# Patient Record
Sex: Female | Born: 1965 | Race: White | Hispanic: No | Marital: Married | Smoking: Former smoker
Health system: Southern US, Community
[De-identification: ages and names within clinical notes are randomized; demographics above are authoritative.]

## PROBLEM LIST (undated history)

## (undated) DIAGNOSIS — F32A Depression, unspecified: Secondary | ICD-10-CM

## (undated) DIAGNOSIS — R079 Chest pain, unspecified: Secondary | ICD-10-CM

## (undated) DIAGNOSIS — E7800 Pure hypercholesterolemia, unspecified: Secondary | ICD-10-CM

## (undated) DIAGNOSIS — K5792 Diverticulitis of intestine, part unspecified, without perforation or abscess without bleeding: Secondary | ICD-10-CM

## (undated) DIAGNOSIS — I10 Essential (primary) hypertension: Secondary | ICD-10-CM

## (undated) DIAGNOSIS — M545 Low back pain, unspecified: Secondary | ICD-10-CM

## (undated) DIAGNOSIS — F419 Anxiety disorder, unspecified: Secondary | ICD-10-CM

## (undated) DIAGNOSIS — G4730 Sleep apnea, unspecified: Secondary | ICD-10-CM

## (undated) DIAGNOSIS — E119 Type 2 diabetes mellitus without complications: Secondary | ICD-10-CM

## (undated) DIAGNOSIS — M069 Rheumatoid arthritis, unspecified: Secondary | ICD-10-CM

## (undated) DIAGNOSIS — F329 Major depressive disorder, single episode, unspecified: Secondary | ICD-10-CM

## (undated) HISTORY — DX: Diverticulitis of intestine, part unspecified, without perforation or abscess without bleeding: K57.92

## (undated) HISTORY — DX: Chest pain, unspecified: R07.9

## (undated) HISTORY — DX: Low back pain, unspecified: M54.50

## (undated) HISTORY — DX: Rheumatoid arthritis, unspecified: M06.9

## (undated) HISTORY — PX: ABDOMINAL HYSTERECTOMY: SHX81

## (undated) HISTORY — DX: Sleep apnea, unspecified: G47.30

## (undated) HISTORY — DX: Anxiety disorder, unspecified: F41.9

## (undated) HISTORY — PX: OTHER SURGICAL HISTORY: SHX169

## (undated) HISTORY — DX: Pure hypercholesterolemia, unspecified: E78.00

## (undated) HISTORY — DX: Type 2 diabetes mellitus without complications: E11.9

## (undated) HISTORY — PX: CHOLECYSTECTOMY: SHX55

## (undated) HISTORY — PX: COLONOSCOPY: SHX174

## (undated) HISTORY — PX: REPLACEMENT TOTAL KNEE: SUR1224

## (undated) HISTORY — DX: Low back pain: M54.5

## (undated) HISTORY — DX: Depression, unspecified: F32.A

## (undated) HISTORY — DX: Essential (primary) hypertension: I10

---
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
## 2016-12-21 HISTORY — PX: ESOPHAGOGASTRODUODENOSCOPY: SHX1529

## 2018-07-03 ENCOUNTER — Encounter: Payer: Self-pay | Admitting: *Deleted

## 2018-07-04 ENCOUNTER — Ambulatory Visit (INDEPENDENT_AMBULATORY_CARE_PROVIDER_SITE_OTHER): Payer: PRIVATE HEALTH INSURANCE | Admitting: Cardiology

## 2018-07-04 ENCOUNTER — Encounter: Payer: Self-pay | Admitting: *Deleted

## 2018-07-04 ENCOUNTER — Encounter: Payer: Self-pay | Admitting: Cardiology

## 2018-07-04 VITALS — BP 148/84 | HR 76 | Ht 66.0 in | Wt 276.6 lb

## 2018-07-04 DIAGNOSIS — R079 Chest pain, unspecified: Secondary | ICD-10-CM

## 2018-07-04 MED ORDER — METOPROLOL TARTRATE 50 MG PO TABS: 50.0000 mg | ORAL_TABLET | Freq: Once | ORAL | 0 refills | Status: DC

## 2018-07-04 MED ORDER — ASPIRIN EC 81 MG PO TBEC: 81.0000 mg | DELAYED_RELEASE_TABLET | Freq: Every day | ORAL | 3 refills | Status: DC

## 2018-07-04 NOTE — Patient Instructions (Addendum)
Medication Instructions:   Your physician has recommended you make the following change in your medication:   Start aspirin 81 mg by mouth daily  Continue all other medications the same  Labwork:  Your physician recommends that you return for lab work in: BMET for coronary CTA  Testing/Procedures:  Coronary CTA to be done at [place]:  Your physician recommends that you schedule a follow-up appointment in: pending.  Any Other Special Instructions Will Be Listed Below (If Applicable). Please arrive at the [facility] main entrance of [facility] at xx:xx AM (30-45 minutes prior to test start time)  [facility] [address] [place], [place] [address] [phone]  Proceed to the [facility] Radiology Department (First Floor).  Please follow these instructions carefully (unless otherwise directed):  On the Night Before the Test: Drink plenty of water. Do not consume any caffeinated/decaffeinated beverages or chocolate 12 hours prior to your test. Do not take any antihistamines 12 hours prior to your test. If you take Metformin do not take 24 hours prior to test and 48 hours after test.  On the Day of the Test: Drink plenty of water. Do not drink any water within one hour of the test. Do not eat any food 4 hours prior to the test. You may take your regular medications prior to the test. IF NOT ON A BETA BLOCKER - Take 50 mg of lopressor (metoprolol) one hour before the test.   After the Test: Drink plenty of water. After receiving IV contrast, you may experience a mild flushed feeling. This is normal. On occasion, you may experience a mild rash up to 24 hours after the test. This is not dangerous. If this occurs, you can take Benadryl 25 mg and increase your fluid intake. If you experience trouble breathing, this can be serious. If it is severe call 911 IMMEDIATELY. If it is mild, please call our office. If you take any of  these medications: Glipizide/Metformin, Avandament, Glucavance, please do not take 48 hours after completing test.    If you need a refill on your cardiac medications before your next appointment, please call your pharmacy.

## 2018-07-04 NOTE — Progress Notes (Signed)
Clinical Summary Teresa Mccoy is a 53 y.o.female seen as new consult, referred by NP Lovena Le for chest pain.    1. Chest pain - symptoms started about 6 months. Stable frequency, stable severity.  - sharp pain, left sided and can sometimes go into right side. Can occur at rest or with exertion. 5/10 in severity. Not positional.Feels flushed, no other symptoms. Lasts about 20 minutes, varies in frequency. Can be 3-4 times a day or once every 3 days  - physiically limited by back. Walks up stairs regularly at home without troubles. - no recent edema   CAD risk facotrs: HTN, DM2, HL, former smoker x 20 years, father with CAD with CABG at 33 and second at 53.    2. Hyperlipidemia 03/2018 TC250  TG 105 HDL 78 LDL 150 - reports history of elevated LFTs. Told she had fatty liver, thus was not started on statin   Past Medical History:  Diagnosis Date  . Chest pain   . Diabetes mellitus without complication ([place])   . Hypertension   . Lower back pain   . Sleep apnea      No Known Allergies   Current Outpatient Medications  Medication Sig Dispense Refill  . lisinopril (PRINIVIL,ZESTRIL) 10 MG tablet Take 10 mg by mouth daily.    . metFORMIN (GLUCOPHAGE) 500 MG tablet Take by mouth 2 (two) times daily with a meal.     No current facility-administered medications for this visit.         No Known Allergies    Family History  Problem Relation Age of Onset  . Hypertension Mother   . Fibromyalgia Mother   . Hypertension Father   . Diabetes Father   . Heart disease Father      Social History Teresa Mccoy reports that she quit smoking about 15 years ago. Her smoking use included cigarettes and e-cigarettes. She has never used smokeless tobacco. Teresa Mccoy has no history on file for alcohol.   Review of Systems CONSTITUTIONAL: No weight loss, fever, chills, weakness or fatigue.  HEENT: Eyes: No visual loss, blurred vision, double vision or yellow sclerae.No  hearing loss, sneezing, congestion, runny nose or sore throat.  SKIN: No rash or itching.  CARDIOVASCULAR: pe rhpi RESPIRATORY: No shortness of breath, cough or sputum.  GASTROINTESTINAL: No anorexia, nausea, vomiting or diarrhea. No abdominal pain or blood.  GENITOURINARY: No burning on urination, no polyuria NEUROLOGICAL: No headache, dizziness, syncope, paralysis, ataxia, numbness or tingling in the extremities. No change in bowel or bladder control.  MUSCULOSKELETAL: No muscle, back pain, joint pain or stiffness.  LYMPHATICS: No enlarged nodes. No history of splenectomy.  PSYCHIATRIC: No history of depression or anxiety.  ENDOCRINOLOGIC: No reports of sweating, cold or heat intolerance. No polyuria or polydipsia.  Marland Kitchen   Physical Examination Vitals:   07/04/18 1447  BP: (!) 148/84  Pulse: 76  SpO2: 96%   Filed Weights   07/04/18 1447  Weight: 276 lb 9.6 oz (125.5 kg)    Gen: resting comfortably, no acute distress HEENT: no scleral icterus, pupils equal round and reactive, no palptable cervical adenopathy,  CV: RRR, no m/r/g, no jvd Resp: Clear to auscultation bilaterally GI: abdomen is soft, non-tender, non-distended, normal bowel sounds, no hepatosplenomegaly MSK: extremities are warm, no edema.  Skin: warm, no rash Neuro:  no focal deficits Psych: appropriate affect      Assessment and Plan  1. Chest pain - mixed somewhat atypical symptoms for chest  pain. She has several strong risk factors for CAD including DM2 and a father with significant CAD in his early 92s - requires additional testing. Unable to run on treadmill due to back pain. Due to her weight and body habitus nuclear imaging and echo would be difficult with lowered sensitivity and high risk for artifact, I think her best option is a coronary CTA which we will arrange.  -start ASA 81mg  daily until coronary status is known  F/u pending test results.    Arnoldo Lenis, M.D.

## 2018-07-11 ENCOUNTER — Telehealth: Payer: Self-pay | Admitting: *Deleted

## 2018-07-11 NOTE — Telephone Encounter (Signed)
-----   Message from Arnoldo Lenis, MD sent at 07/11/2018  3:49 PM EST ----- Labs look fine   Zandra Abts MD

## 2018-07-11 NOTE — Telephone Encounter (Signed)
Pt aware - routed to pcp  

## 2018-08-10 ENCOUNTER — Telehealth (HOSPITAL_COMMUNITY): Payer: Self-pay | Admitting: Emergency Medicine

## 2018-08-10 NOTE — Telephone Encounter (Signed)
Left message on voicemail with name and callback number Erie Radu RN Navigator Cardiac Imaging  Heart and Vascular Services [phone] Office [phone] Cell  

## 2018-08-13 ENCOUNTER — Encounter (HOSPITAL_COMMUNITY): Payer: Self-pay

## 2018-08-13 ENCOUNTER — Ambulatory Visit (HOSPITAL_COMMUNITY)
Admission: RE | Admit: 2018-08-13 | Discharge: 2018-08-13 | Disposition: A | Discharge status: Home / Self Care | Payer: PRIVATE HEALTH INSURANCE | Source: Ambulatory Visit | Attending: Cardiology | Admitting: Cardiology

## 2018-08-13 DIAGNOSIS — R079 Chest pain, unspecified: Secondary | ICD-10-CM | POA: Diagnosis present

## 2018-08-13 MED ORDER — NITROGLYCERIN 0.4 MG SL SUBL
SUBLINGUAL_TABLET | SUBLINGUAL | Status: AC
  Filled 2018-08-13: qty 2

## 2018-08-13 MED ORDER — NITROGLYCERIN 0.4 MG SL SUBL
0.8000 mg | SUBLINGUAL_TABLET | Freq: Once | SUBLINGUAL | Status: AC
  Administered 2018-08-13: 0.8 mg via SUBLINGUAL
  Filled 2018-08-13: qty 25

## 2018-08-13 MED ORDER — IOPAMIDOL (ISOVUE-370) INJECTION 76%
80.0000 mL | Freq: Once | INTRAVENOUS | Status: AC | PRN
  Administered 2018-08-13: 100 mL via INTRAVENOUS

## 2018-08-13 NOTE — Progress Notes (Signed)
Pt tolerated exam without incident.  Denied dizziness, pt stated she has slight headache.  Pt given verbal instructions to drink water post contrast administration.  Pt discharged.

## 2018-08-13 NOTE — Progress Notes (Signed)
BP 100/60 post heart CTA and nitro admin.  Pt denies dizziness or lightheadedness.  Crackers and caffeine given to patient.  Will cont to monitor patient

## 2018-08-13 NOTE — Discharge Instructions (Signed)
What You Need to Know About IV Contrast Material °IV contrast material is most often a fluid that is used with some imaging tests. Contrast material is injected into your body through a vein to help your health care providers see your organs and tissues more clearly. It may be used with: °· X-ray. °· MRI. °· CT. °· Ultrasound. °Contrast material is used when your health care providers need a detailed look at organs, tissues, or blood vessels that may not show up with the standard test. IV contrast may be used for imaging tests that examine: °· Muscles, skin, and fat. °· Breasts. °· Brain. °· Digestive tract. °· Heart. °· Liver. °· Lungs and many other internal organs. °What are the risks of using IV contrast material? °The risks of using IV contrast material include: °· Headache. °· Itching, skin rash, and hives. °· Allergic reactions. °· Nausea and vomiting. °· Wheezing or difficulty breathing. °· Abnormal heart rate. °· Blood pressure changes. °· Throat swelling. °· Kidney damage. °These complications are more likely to occur in people who: °· Have kidney failure. °· Have liver problems. °· Have certain heart problems, including: °? Heart failure. °? Heart attack. °? Heart infection. °? Heart valve problems. °· Abuse alcohol. °· Have allergies or asthma. °· Are dehydrated. °· Have sickle cell anemia or similar problems. °· Have had trouble with IV contrast material in the past. °· Take certain medicines, such as: °? Metformin. °? NSAIDs. °? Beta blockers. °? Interleukin-2. °How do I prepare for my test with IV contrast material? °· Follow instructions from your health care provider about eating or drinking restrictions. °· Ask your health care provider about changing or stopping your regular medicines. This is especially important if you are taking diabetes medicines or blood thinners. °· Tell your health care provider about: °? Any previous illnesses, surgeries, or pre-existing medical conditions. °? Whether you  are pregnant or may be pregnant. °? Whether you are breastfeeding. Most contrast agents are safe for use in breastfeeding women. °· You may have a physical exam to determine any potential risks. °· Ask if you will be given a medicine (sedative) to help you relax during the procedure. If so, plan to have someone take you home after test. °What happens during the test with IV contrast material? ° °· You may be given a sedative to help you relax. °· A needle will be inserted into one of your veins to administer the IV contrast material. °· You may feel warmth or flushing as the material enters your bloodstream. °· You may have a metallic taste in your mouth for a few minutes. °· The needle may cause some discomfort and bruising. °· After the contrast material is in your body, the imaging test will be done. °The procedure may vary among health care providers and hospitals. °What happens after the test with IV contrast material? °· You may be asked to drink water or other fluids to wash (flush) the contrast material out of your body. °· Drink enough fluid to keep your urine pale yellow. °· Do not drive for 24 hours if you received a sedative. °· It is your responsibility to get your test results. Ask your health care provider or the department performing the test when your results will be ready. °Contact a health care provider if: °· You have redness, swelling, or pain near your IV site. °Get help right away if: °· You have an abnormal heart rhythm. °· You have trouble breathing. °· You have: °?   Chest pain. °? Pain in your back, neck, arm, jaw, or stomach. °? Nausea or sweating. °? Hives or a rash. °· You start shaking and cannot stop. °These symptoms may represent a serious problem that is an emergency. Do not wait to see if the symptoms will go away. Get medical help right away. Call your local emergency services (911 in the [place] Do not drive yourself to the hospital. °Summary °· IV contrast may be used for imaging  tests to help your health care providers see your organs and tissues more clearly. °· Tell your health care provider if you are pregnant or may be pregnant. °· During the procedure, you may feel warmth or flushing as the material enters your bloodstream. °· After the procedure, drink enough fluid to keep your urine pale yellow. °This information is not intended to replace advice given to you by your health care provider. Make sure you discuss any questions you have with your health care provider. °Document Released: 06/01/2009 Document Revised: 02/05/2018 Document Reviewed: 02/18/2015 °Elsevier Interactive Patient Education © 2019 [employer] Inc[employer] ° °

## 2018-08-17 ENCOUNTER — Telehealth: Payer: Self-pay | Admitting: *Deleted

## 2018-08-17 NOTE — Telephone Encounter (Signed)
-----   Message from Arnoldo Lenis, MD sent at 08/17/2018  1:12 PM EST ----- CT of the heart is completely normal and shows no blockages, and more specifically does not show any  plaque or calcium that has even started to form in the arteries of her heart. No further testing, can follow up with pcp to discuss other  Causes of her symptoms, follow up with [place] as needed   Zandra Abts MD

## 2018-08-17 NOTE — Telephone Encounter (Signed)
Patient informed. Copy sent to PCP °

## 2019-07-01 ENCOUNTER — Encounter: Payer: Self-pay | Admitting: Internal Medicine

## 2019-07-14 ENCOUNTER — Encounter: Payer: Self-pay | Admitting: Gastroenterology

## 2019-07-14 NOTE — Progress Notes (Signed)
Referring Provider: Marylee Floras, FNP Primary Care Physician:  Marylee Floras, FNP Primary Gastroenterologist:  Dr. Gala Romney  Chief Complaint  Patient presents with  . Bloated    HPI:   Teresa Mccoy is a 54 y.o. female presenting today at the request of Marylee Floras, FNP for abdominal bloating.   Reviewed recent PCP encounter dated 06/24/2019.  Patient was seen for abdominal bloating/abdominal pain.  Patient reported RUQ, LUQ, epigastric bloating, fullness, tenderness.  Present greater than 6 months.  Eating worsened symptoms.  She is status post cholecystectomy.  She had labs drawn earlier on 06/06/19 with rising AST/ALT at 64/91.  Total cholesterol/LDL also elevated. HbA1C 7. CBC within normal limits. Plans were to check amylase and lipase, abdominal ultrasound ordered, started on lansoprazole 30 mg every morning and famotidine 20 mg every afternoon, and refer to GI.  Amylase and lipase were both normal at 37 and 27 respectively.  Complete abdominal ultrasound dated 07/01/2019 for abdominal distention, epigastric pain, increasing LFTs. Impression: Unremarkable abdominal sonogram.  Status post cholecystectomy. Reviewed findings.  Stated the liver is normal in size and echotexture.  CBD 4 mm.  No intrahepatic ductal dilation.  Pancreas and spleen appeared normal.  CT Abdomen and pelvis with contrast dated 07/02/2019 for abdominal distention. Impression: Diverticulosis without inflammatory changes.  No acute intra-abdominal or pelvic processes. Reviewed findings.  Liver with diffuse low attenuating architecture without focal abnormalities.  Pancreas, spleen, kidneys, adrenal glands normal. No ascites noted.  Today:   Bloating: Present x 7 months. Admits to early satiety. States she eats a quarter of what she used to eat. After eating breakfast, she will feel full almost all day and may or may not eat something small for dinner. Also gets full quickly off of liquids. Sips on a soda  all day. May drink one or two a day. Reports a lot of nausea. This is daily. Started around the same time bloating/early satiety started. No vomiting. Admits to a lot of heartburn and indigestion. This is daily. Started within the last 5 months. Has been worsening. Has taken Rolaids, tums, and omeprazole 20 mg. Omeprazole is her husbands. She took that for about 3-4 days. No significant improvement in symptoms. Not currently taking anything daily. Lost about 4 lbs in the last 4 months. Denies dysphagia. Hasn't identified any aggravating factors or triggers of symptoms. Drinks soda daily. Eating very few vegetables. States she has she has tenderness when pressing on her upper abdomen.   Some days may go a couple days without a BM. Other times will have small BMs or diarrhea. Typically has looser stools, but usually not more than 1 in 24 hours. This is her baseline. States she strains when having a BM. No blood in the stool. No black stools. She does eat bread frequently but hasn't noticed this bothering her. LLQ pain started yesterday. This is very mild. Has some pain this morning. None right now. Reports history of diverticulitis years ago. Doesn't feel similar. Last BM was yesterday morning.   Elevated LFTs: No regular Tylenol use.  No history of IV or intranasal drugs. Alcohol, on the weekend. When she drinks, will drink 8-9 beer. No OTC supplements or herbal teas. Uncle with history of cirrhosis secondary to alcohol. She has RA. Otherwise, no other personal history of autoimmune conditions. Maternal uncle with crohns disease. No FHx of alpha-1-antitrypsin deficiency, ceruloplasmin, or UC.  Denies yellowing of the skin or eyes, swelling in her abdomen or lower extremities, confusion,  dark urine, or bruising.  Takes 800 mg ibuprofen for headaches for the past 3-4 weeks. Prior to this, would take ibuprofen a couple times a week for "a while".   Has been taking prednisone daily since March 2020. This is for  RA.   Reports having a colonoscopy 5 years ago. [facility] hospital. Denies polyps. States she was having constipation at that time. Also had EGD at that time. Was told she had an ulcer and a hiatal hernia.   Past Medical History:  Diagnosis Date  . Anxiety   . Chest pain   . Depression   . Diabetes mellitus without complication ([place])   . Diverticulitis   . Hypercholesterolemia   . Hypertension   . Lower back pain   . Rheumatoid arthritis ([place])   . Sleep apnea    on CPAP    Past Surgical History:  Procedure Laterality Date  . COLONOSCOPY    . ESOPHAGOGASTRODUODENOSCOPY    . OTHER SURGICAL HISTORY     HYSTERECTOMY 06/27/1997  . 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 lisinopril (PRINIVIL,ZESTRIL) 10 MG tablet Take 10 mg by mouth daily.    . metFORMIN (GLUCOPHAGE) 500 MG tablet Take by mouth 2 (two) times daily with a meal.    . predniSONE (DELTASONE) 10 MG tablet Take 1 tablet by mouth daily.    Marland Kitchen sulfaSALAzine (AZULFIDINE) 500 MG tablet Take 1 tablet by mouth 2 (two) times daily.    . ondansetron (ZOFRAN) 4 MG tablet Take 1 tablet (4 mg total) by mouth every 8 (eight) hours as needed for nausea or vomiting. 30 tablet 1  . pantoprazole (PROTONIX) 40 MG tablet Take 1 tablet (40 mg total) by mouth daily before breakfast. 30 tablet 5   No current facility-administered medications for this visit.    Allergies as of 07/15/2019  . (No Known Allergies)    Family History  Problem Relation Age of Onset  . Hypertension Mother   . Fibromyalgia Mother   . Hypertension Father   . Diabetes Father   . Heart disease Father   . Colon cancer Neg Hx     Social History   Socioeconomic History  . Marital status: Married    Spouse name: Not on file  . Number of children: Not on file  . Years of education: Not on file  . Highest education level: Not on file  Occupational History  . Not on file   Tobacco Use  . Smoking status: Current Some Day Smoker    Types: E-cigarettes    Last attempt to quit: 06/28/2003    Years since quitting: 16.0  . Smokeless tobacco: Never Used  . Tobacco comment: does e-ciggs now   Substance and Sexual Activity  . Alcohol use: Yes    Comment: socially; 8-9 beer on the weekend  . Drug use: Not Currently  . Sexual activity: Not on file  Other Topics Concern  . Not on file  Social History Narrative  . Not on file   Social Determinants of Health   Financial Resource Strain:   . Difficulty of Paying Living Expenses: Not on file  Food Insecurity:   . Worried About Charity fundraiser in the Last Year: Not on file  . Ran Out of Food in the Last Year: Not on file  Transportation Needs:   . Lack of Transportation (Medical): Not on file  .  Lack of Transportation (Non-Medical): Not on file  Physical Activity:   . Days of Exercise per Week: Not on file  . Minutes of Exercise per Session: Not on file  Stress:   . Feeling of Stress : Not on file  Social Connections:   . Frequency of Communication with Friends and Family: Not on file  . Frequency of Social Gatherings with Friends and Family: Not on file  . Attends Religious Services: Not on file  . Active Member of Clubs or Organizations: Not on file  . Attends Archivist Meetings: Not on file  . Marital Status: Not on file  Intimate Partner Violence:   . Fear of Current or Ex-Partner: Not on file  . Emotionally Abused: Not on file  . Physically Abused: Not on file  . Sexually Abused: Not on file    Review of Systems: Gen: Denies any fever, chills, fatigue, weight loss, lack of appetite.  CV: Denies chest pain, heart palpitations, peripheral edema, syncope.  Resp: Denies shortness of breath at rest or with exertion. Denies wheezing or cough.  GI: Denies dysphagia or odynophagia. Denies jaundice, hematemesis, fecal incontinence. GU : Denies urinary burning, urinary frequency, urinary  hesitancy MS: Denies joint pain, muscle weakness, cramps, or limitation of movement.  Derm: Denies rash, itching, dry skin Psych: Denies depression, anxiety, memory loss, and confusion Heme: Denies bruising, bleeding, and enlarged lymph nodes.  Physical Exam: BP (!) 153/97   Pulse 81   Temp (!) 96.8 F (36 C) (Temporal)   Ht '5\' 6"'  (1.676 m)   Wt 277 lb 9.6 oz (125.9 kg)   BMI 44.81 kg/m  General:   Alert and oriented. Pleasant and cooperative. Well-nourished and well-developed.  Head:  Normocephalic and atraumatic. Eyes:  Without icterus, sclera clear and conjunctiva pink.  Ears:  Normal auditory acuity. Lungs:  Clear to auscultation bilaterally. No wheezes, rales, or rhonchi. No distress.  Heart:  S1, S2 present without murmurs appreciated.  Abdomen:  Obese. +BS, soft, and non-distended. Mild tenderness to palpation in the epigastric area. Minimal tenderness in the LUQ. No HSM noted. No guarding or rebound. No masses appreciated.  Rectal:  Deferred  Msk:  Symmetrical without gross deformities. Normal posture. Extremities:  Without edema. Neurologic:  Alert and  oriented x4;  grossly normal neurologically. Skin:  Intact without significant lesions or rashes. Psych: Normal mood and affect.  Labs: 06/06/2019 Lipid panel: Total cholesterol 278 (H), HDL 72, LDL 184 (H), triglycerides 101 CMP: Glucose 120 (H), creatinine 0.6, sodium 138, potassium 4.4, chloride 101, calcium 9.7, total protein 7.3, albumin 4.1, total bilirubin 0.5, alk phos 94, AST 64 (H), ALT 91 (H). CBC: WBC 9.7, hemoglobin 14.7, hematocrit 43.8%, MCV 94.2, MCH 31.6, MCHC 33.6, platelet count 224 Hemoglobin A1c 7   06/24/2019 Amylase normal at 37, lipase normal at 27.

## 2019-07-15 ENCOUNTER — Other Ambulatory Visit: Payer: Self-pay

## 2019-07-15 ENCOUNTER — Encounter: Payer: Self-pay | Admitting: Gastroenterology

## 2019-07-15 ENCOUNTER — Ambulatory Visit (INDEPENDENT_AMBULATORY_CARE_PROVIDER_SITE_OTHER): Payer: PRIVATE HEALTH INSURANCE | Admitting: Gastroenterology

## 2019-07-15 VITALS — BP 153/97 | HR 81 | Temp 96.8°F | Ht 66.0 in | Wt 277.6 lb

## 2019-07-15 DIAGNOSIS — R12 Heartburn: Secondary | ICD-10-CM | POA: Insufficient documentation

## 2019-07-15 DIAGNOSIS — R7989 Other specified abnormal findings of blood chemistry: Secondary | ICD-10-CM

## 2019-07-15 DIAGNOSIS — R1032 Left lower quadrant pain: Secondary | ICD-10-CM | POA: Diagnosis not present

## 2019-07-15 DIAGNOSIS — R6881 Early satiety: Secondary | ICD-10-CM | POA: Diagnosis not present

## 2019-07-15 DIAGNOSIS — R110 Nausea: Secondary | ICD-10-CM

## 2019-07-15 MED ORDER — PANTOPRAZOLE SODIUM 40 MG PO TBEC: 40.0000 mg | DELAYED_RELEASE_TABLET | Freq: Every day | ORAL | 5 refills | Status: DC

## 2019-07-15 MED ORDER — ONDANSETRON HCL 4 MG PO TABS: 4.0000 mg | ORAL_TABLET | Freq: Three times a day (TID) | ORAL | 1 refills | Status: DC | PRN

## 2019-07-15 NOTE — Patient Instructions (Addendum)
Have labs completed.   We will get you scheduled for an upper endoscopy in the near future with Dr. Gala Romney.  Start Protonix 40 mg daily, 30 minutes before breakfast.   Use Zofran 4 mg every 8 hours as needed for nausea.   Avoid fried, fatty, greasy, spicy, and citrus foods. Avoid caffeine, carbonated beverages, and chocolate. Try eating 4-6 small meals a day. Do not eat within 3 hours of laying down.   Please decrease ibuprofen use. This is very important regarding your upper GI symptoms. It would be best to avoid all NSAIDs including ibuprofen, advil, aleve, and goody powders.   If you take tylenol, do not take more than 2 g daily.   Please work on decreasing your alcohol intake. This may be contributing to your elevated liver function tests.   Try adding MiraLAX 1 capful daily. I wonder if you have some underlying constipation as you have small loose stools.  If you develop frequent loose diarrhea, you may decrease this.   We will follow-up with you in the office after your procedure. Call if questions or concerns prior.   Aliene Altes, PA-C [facility] Gastroenterology

## 2019-07-16 ENCOUNTER — Telehealth: Payer: Self-pay | Admitting: Gastroenterology

## 2019-07-16 ENCOUNTER — Other Ambulatory Visit: Payer: Self-pay

## 2019-07-16 ENCOUNTER — Encounter: Payer: Self-pay | Admitting: Gastroenterology

## 2019-07-16 DIAGNOSIS — R7401 Elevation of levels of liver transaminase levels: Secondary | ICD-10-CM

## 2019-07-16 DIAGNOSIS — R7989 Other specified abnormal findings of blood chemistry: Secondary | ICD-10-CM | POA: Insufficient documentation

## 2019-07-16 LAB — IRON,TIBC AND FERRITIN PANEL
%SAT: 26 % (calc) (ref 16–45)
Ferritin: 963 ng/mL — ABNORMAL HIGH (ref 16–232)
Iron: 94 ug/dL (ref 45–160)
TIBC: 359 mcg/dL (calc) (ref 250–450)

## 2019-07-16 LAB — COMPREHENSIVE METABOLIC PANEL
AG Ratio: 1.4 (calc) (ref 1.0–2.5)
ALT: 129 U/L — ABNORMAL HIGH (ref 6–29)
AST: 151 U/L — ABNORMAL HIGH (ref 10–35)
Albumin: 4.4 g/dL (ref 3.6–5.1)
Alkaline phosphatase (APISO): 97 U/L (ref 37–153)
BUN: 9 mg/dL (ref 7–25)
CO2: 30 mmol/L (ref 20–32)
Calcium: 10.7 mg/dL — ABNORMAL HIGH (ref 8.6–10.4)
Chloride: 100 mmol/L (ref 98–110)
Creat: 0.66 mg/dL (ref 0.50–1.05)
Globulin: 3.2 g/dL (calc) (ref 1.9–3.7)
Glucose, Bld: 107 mg/dL (ref 65–139)
Potassium: 4.7 mmol/L (ref 3.5–5.3)
Sodium: 138 mmol/L (ref 135–146)
Total Bilirubin: 0.5 mg/dL (ref 0.2–1.2)
Total Protein: 7.6 g/dL (ref 6.1–8.1)

## 2019-07-16 LAB — HEPATITIS C ANTIBODY
Hepatitis C Ab: NONREACTIVE
SIGNAL TO CUT-OFF: 0.01 (ref <1.00)

## 2019-07-16 LAB — HEPATITIS A ANTIBODY, TOTAL: Hepatitis A AB,Total: NONREACTIVE

## 2019-07-16 LAB — HEPATITIS B SURFACE ANTIGEN: Hepatitis B Surface Ag: NONREACTIVE

## 2019-07-16 LAB — HEPATITIS B SURFACE ANTIBODY,QUALITATIVE: Hep B S Ab: NONREACTIVE

## 2019-07-16 LAB — HEPATITIS B CORE ANTIBODY, TOTAL: Hep B Core Total Ab: NONREACTIVE

## 2019-07-16 NOTE — Telephone Encounter (Signed)
Teresa Mccoy, when you call patient about her lab results, can you also verify her medication list one more time? I finished going through the referral paperwork after patient left the office and on the med list from PCP it list: clonazepam 0.25 mg denigrating tablet twice a day as needed, Escitalopram 20 mg tablet daily, lisinopril 40 mg daily. The other medications she reported at her OV are the same.

## 2019-07-16 NOTE — Assessment & Plan Note (Addendum)
New onset of heartburn and indigestion within the last 5 months.  This is in the setting of early satiety, abdominal bloating, and daily nausea x7 months as addressed above.  Has tried Rolaids, Tums, and omeprazole 20 mg without significant improvement.  No dysphagia symptoms.  Denies bright red blood per rectum or melena.  4 pound weight loss in the last 4 months.  Admits to taking 800 mg ibuprofen for headaches for the past 3-4 weeks.  Prior to this, would take 800 mg ibuprofen a couple times a week for "a while."  Drinks soda daily.  Suspect heartburn may be related to underlying etiology of early satiety and bloating with differentials gastroparesis, pyloric stenosis, or some other obstructing process causing delayed stomach emptying including malignancy although less likely.  Body habitus and dietary habits may also be playing a role as her BMI is 44.8 and she drinks soda daily.  Also drinking 8-9 beer on the weekends.  Start Protonix 40 mg daily 30 minutes before breakfast. Zofran 4 mg every 8 hours as needed for nausea. Avoid fried, fatty, greasy, spicy, and citrus foods. Avoid caffeine, carbonated beverages, and chocolate. Try eating 4-6 small meals a day. Do not eat within 3 hours of laying down.  Decrease ibuprofen use and avoid all other NSAIDs. Decrease alcohol use.  She is getting scheduled for EGD with propofol with Dr. Gala Romney in the near future to evaluate early satiety, bloating, nausea as discussed above.

## 2019-07-16 NOTE — Progress Notes (Signed)
AST and ALT have continued to increase. AST 151, ALT 129. This is up from AST 64 and ALT 91 in December 2020. Negative for Hep A, B, and C. No immunity to hepatitis A or B. Iron panel with elevated ferritin at 963. It is possible this is related to her rheumatoid arthritis, but we will need to evaluate for hemochromatosis. Her calcium was also noted to be slightly elevated at 10.7, I will let her follow up with her PCP on this.   Instructions: 1. Complete labs for hemochromatosis. Elmo Putt, please arrange.  2. Recheck her HFP in 4 weeks to ensure LFTs are not continuing to increase. Please arrange.  3. She needs to see PCP for have Hep A and Hep B vaccine.  4. Patient admitted to drinking alcohol on the weekends. It is imperative she abstain from all alcohol for now as her LFTs continue to rise. This is contributing to the elevation and will only cloud the picture moving forward.   5. Additionally, and needs to work on 1-2# weight loss per week until ideal body weight through exercise & diet. 6. Follow a low fat/cholesterol diet. Avoid sweets, sodas, fruit juices, sweetened beverages like tea, etc. 7. Gradually increase exercise from 15 min daily up to 1 hr per day 5 days/week. 8. Avoid all OTC supplements and herbal teas.  9. Limit tylenol.  10. Follow-up with PCP regarding elevated calcium.   Manuela Schwartz, please send lab results to PCP.

## 2019-07-16 NOTE — Assessment & Plan Note (Addendum)
New onset of LLQ abdominal pain that started yesterday.  This is very mild.  Had pain this morning but none at the time of office visit.  She does report history of diverticulitis but states symptoms are not similar at this time.  Also reports somewhat irregular bowel frequency.  She may go a couple days without a BM, other times have small BMs, or other times have loose stools.  Most stools are with straining.  Denies bright red blood per rectum or melena.  Reports having a colonoscopy about 5 years ago at [place].  Denies polyps.  She is advised to continue to monitor LLQ abdominal pain and let us know if this worsens. Query whether she may have some underlying constipation with overflow diarrhea.  She was advised to take MiraLAX 1 capful (17 g) daily with 8 ounces of water.  If she began having frequent loose stools, she was advised to decrease the frequency. Request [facility] records. Follow-up after EGD for upper GI symptoms as discussed above.

## 2019-07-16 NOTE — Assessment & Plan Note (Addendum)
54 year old female presenting for further evaluation of abdominal bloating and early satiety x7 months.  Associated with daily nausea without vomiting, new onset of heartburn indigestion within the last 5 months that has been worsening.  She has tried Rolaids, Tums, and omeprazole without significant improvement in her symptoms.  She has had CT and abdominal ultrasound as detailed in HPI that were unrevealing.  She does report taking 800 mg ibuprofen for headaches for the last 3-4 weeks and prior to this taking 800 mg ibuprofen a couple times a week for "a while".  She is also been on prednisone 10 mg daily for rheumatoid arthritis since March 2020 for RA.  Drinks 8-9 beer on the weekend.  History significant for diabetes, most recent A1c 7.  Also reports having an EGD about 5 years ago in [place] with an ulcer and a hiatal hernia. Denies bright red blood per rectum or melena.  4 pound weight loss in the last 4 months.  Exam with mild tenderness to palpation in the epigastric area and minimal tenderness in the left upper quadrant.   Differentials include gastroparesis, pyloric stenosis, or some other obstructive process causing delayed gastric emptying including malignancy although this is less likely.  May also have gastritis, duodenitis, esophagitis.  With history of NSAID use as well as prednisone, also concern for possible peptic ulcer disease. As these are new symptoms and I patient over 50, I feel it is most appropriate to go ahead and evaluate her upper GI tract with an upper endoscopy.  Proceed with EGD with propofol with Dr. Gala Romney in the near future. The risks, benefits, and alternatives have been discussed in detail with patient. They have stated understanding and desire to proceed.  Propofol due to BMI and alcohol use.  Of note, clonazepam and Lexapro on med list from PCP although patient did not report taking these.  We will have nursing staff confirm this. Start Protonix 40 mg daily 30  minutes before breakfast. Zofran 4 mg every 8 hours as needed for nausea.  Avoid fried, fatty, greasy, spicy, and citrus foods. Avoid caffeine, carbonated beverages, and chocolate. Try eating 4-6 small meals a day. Do not eat within 3 hours of laying down.  Please decrease ibuprofen use and avoid all other NSAIDs. Decrease alcohol use. Request EGD records. Follow-up after procedure.

## 2019-07-16 NOTE — Assessment & Plan Note (Signed)
Addressed under early satiety. 

## 2019-07-16 NOTE — Assessment & Plan Note (Addendum)
54 year old female with history significant for HTN, HLD, diabetes, rheumatoid arthritis, and obesity who was noted to have elevated AST/ALT.  Most recent labs with PCP on 06/06/2019 with AST 64 and ALT 91.  CBC within normal limits.  Alk phos and bilirubin within normal limits.  Total cholesterol/LDL also elevated.  Hemoglobin A1c 7.  Abdominal ultrasound on 07/01/2019 unremarkable.  CT abdomen and pelvis with contrast on 07/02/2019 stated liver with diffuse low attenuating architecture without focal abnormalities.  Admits to drinking 8-9 beer on the weekends. She is on sulfasalazine for RA. Denies history of IV or intranasal drug use.  No OTC supplements or herbal teas.  No regular Tylenol use. One maternal uncle with cirrhosis secondary to alcohol.  Another maternal uncle with Crohn's disease.  No other family history of autoimmune conditions.  No history of alpha-1 antitrypsin deficiency or ceruloplasmin in the family.    Mild elevation of LFTs may be secondary to alcohol, underlying fatty liver, med effect secondary to sulfasalazine.  We will also need to rule out hepatitis and iron overload.  At this time, I will recheck her LFTs and obtain hepatitis A, hepatitis B, hepatitis C serologies, and an iron panel. She was advised to decrease her alcohol intake. If she were to take Tylenol, advised not to take more than 2 g in 24 hours, but this should be limited.  Further recommendations to follow.

## 2019-07-17 ENCOUNTER — Encounter: Payer: Self-pay | Admitting: Gastroenterology

## 2019-07-17 ENCOUNTER — Telehealth: Payer: Self-pay | Admitting: Gastroenterology

## 2019-07-17 NOTE — Telephone Encounter (Signed)
Noted  

## 2019-07-17 NOTE — Telephone Encounter (Signed)
Spoke with pt, pt is taking Lisinopril 20 mg qd (hasn't started the 40 mg that her PCP wants her to start), Metformin 500mg  bid, Zofran 4 mg 8 hrs, Pantoprazole 40 mg qd, Prednisone 10 mg qd, sulfasalzine 500 mg qd

## 2019-07-17 NOTE — Telephone Encounter (Signed)
Noted, will discuss with pt when her lab results come in as directed per [facility].

## 2019-07-17 NOTE — Telephone Encounter (Signed)
Received and reviewed EGD report from [facility]  EGD dated 12/21/2016 Surgeon:B I Claudie Leach, MD Indication: Nausea, vomiting, and abdominal pain. Findings: Normal esophagus, small hiatal hernia, nonbleeding erosions in the gastric antrum s/p biopsied, pylorus and duodenum normal  Recommendations: Remain off NSAIDs if possible, obtain gastric emptying study, follow-up in office.  Pathology: Chronic nonspecific gastritis, H. pylori negative.  No additional recommendations at this time.  We will have report scanned in the patient's chart.

## 2019-07-22 ENCOUNTER — Telehealth: Payer: Self-pay

## 2019-07-22 NOTE — Telephone Encounter (Signed)
Hemochromatosis labs still in process.  Unfortunately, this takes a few days to result.  We will reach out to her as soon as we have the result.

## 2019-07-22 NOTE — Telephone Encounter (Signed)
VM received. Returned pts call. Pt is inquiring about her lab results. Please advise.

## 2019-07-22 NOTE — Telephone Encounter (Signed)
PT is aware the lab is still in process and we will let her know when we get it.

## 2019-07-23 LAB — HEPATIC FUNCTION PANEL
AG Ratio: 1.5 (calc) (ref 1.0–2.5)
ALT: 108 U/L — ABNORMAL HIGH (ref 6–29)
AST: 102 U/L — ABNORMAL HIGH (ref 10–35)
Albumin: 4.2 g/dL (ref 3.6–5.1)
Alkaline phosphatase (APISO): 94 U/L (ref 37–153)
Bilirubin, Direct: 0.1 mg/dL (ref 0.0–0.2)
Globulin: 2.8 g/dL (calc) (ref 1.9–3.7)
Indirect Bilirubin: 0.5 mg/dL (calc) (ref 0.2–1.2)
Total Bilirubin: 0.6 mg/dL (ref 0.2–1.2)
Total Protein: 7 g/dL (ref 6.1–8.1)

## 2019-07-23 LAB — HEMOCHROMATOSIS DNA-PCR(C282Y,H63D)

## 2019-07-24 NOTE — Progress Notes (Signed)
Hemochromatosis result is only positive for one gene variant we check for. This likely means she is a carrier for hemochromatosis but does not actually have hemochromatosis. However, there are other possible gene variants that we do not test for. As her ferritin is quite elevated, I would like for her to see hematology to get their opinion as well.   Otherwise, she should continue with prior recommendations (see prior result note). We will follow-up with her after her EGD as planned.   [facility] Clinical Pool: Please place referral to hematology if patient is agreeable.

## 2019-07-25 ENCOUNTER — Other Ambulatory Visit: Payer: Self-pay

## 2019-07-25 DIAGNOSIS — R7989 Other specified abnormal findings of blood chemistry: Secondary | ICD-10-CM

## 2019-07-31 ENCOUNTER — Inpatient Hospital Stay (HOSPITAL_COMMUNITY): Payer: PRIVATE HEALTH INSURANCE | Attending: Hematology | Admitting: Hematology

## 2019-07-31 ENCOUNTER — Inpatient Hospital Stay (HOSPITAL_COMMUNITY): Payer: PRIVATE HEALTH INSURANCE

## 2019-07-31 ENCOUNTER — Encounter (HOSPITAL_COMMUNITY): Payer: Self-pay | Admitting: Hematology

## 2019-07-31 ENCOUNTER — Other Ambulatory Visit (HOSPITAL_COMMUNITY): Payer: PRIVATE HEALTH INSURANCE

## 2019-07-31 ENCOUNTER — Other Ambulatory Visit: Payer: Self-pay

## 2019-07-31 DIAGNOSIS — Z801 Family history of malignant neoplasm of trachea, bronchus and lung: Secondary | ICD-10-CM | POA: Diagnosis not present

## 2019-07-31 DIAGNOSIS — R7989 Other specified abnormal findings of blood chemistry: Secondary | ICD-10-CM | POA: Diagnosis present

## 2019-07-31 DIAGNOSIS — R5383 Other fatigue: Secondary | ICD-10-CM | POA: Diagnosis not present

## 2019-07-31 DIAGNOSIS — R630 Anorexia: Secondary | ICD-10-CM | POA: Diagnosis not present

## 2019-07-31 DIAGNOSIS — Z808 Family history of malignant neoplasm of other organs or systems: Secondary | ICD-10-CM

## 2019-07-31 DIAGNOSIS — Z96652 Presence of left artificial knee joint: Secondary | ICD-10-CM | POA: Diagnosis not present

## 2019-07-31 DIAGNOSIS — R519 Headache, unspecified: Secondary | ICD-10-CM

## 2019-07-31 DIAGNOSIS — Z148 Genetic carrier of other disease: Secondary | ICD-10-CM | POA: Diagnosis not present

## 2019-07-31 DIAGNOSIS — Z90710 Acquired absence of both cervix and uterus: Secondary | ICD-10-CM

## 2019-07-31 DIAGNOSIS — F17210 Nicotine dependence, cigarettes, uncomplicated: Secondary | ICD-10-CM | POA: Insufficient documentation

## 2019-07-31 DIAGNOSIS — K760 Fatty (change of) liver, not elsewhere classified: Secondary | ICD-10-CM | POA: Diagnosis not present

## 2019-07-31 DIAGNOSIS — M069 Rheumatoid arthritis, unspecified: Secondary | ICD-10-CM | POA: Diagnosis not present

## 2019-07-31 LAB — CBC WITH DIFFERENTIAL/PLATELET
Abs Immature Granulocytes: 0.09 10*3/uL — ABNORMAL HIGH (ref 0.00–0.07)
Basophils Absolute: 0.1 10*3/uL (ref 0.0–0.1)
Basophils Relative: 1 %
Eosinophils Absolute: 0.2 10*3/uL (ref 0.0–0.5)
Eosinophils Relative: 2 %
HCT: 46.5 % — ABNORMAL HIGH (ref 36.0–46.0)
Hemoglobin: 15.2 g/dL — ABNORMAL HIGH (ref 12.0–15.0)
Immature Granulocytes: 1 %
Lymphocytes Relative: 28 %
Lymphs Abs: 2.7 10*3/uL (ref 0.7–4.0)
MCH: 31.1 pg (ref 26.0–34.0)
MCHC: 32.7 g/dL (ref 30.0–36.0)
MCV: 95.1 fL (ref 80.0–100.0)
Monocytes Absolute: 0.6 10*3/uL (ref 0.1–1.0)
Monocytes Relative: 7 %
Neutro Abs: 5.9 10*3/uL (ref 1.7–7.7)
Neutrophils Relative %: 61 %
Platelets: 220 10*3/uL (ref 150–400)
RBC: 4.89 MIL/uL (ref 3.87–5.11)
RDW: 13 % (ref 11.5–15.5)
WBC: 9.6 10*3/uL (ref 4.0–10.5)
nRBC: 0 % (ref 0.0–0.2)

## 2019-07-31 LAB — COMPREHENSIVE METABOLIC PANEL
ALT: 106 U/L — ABNORMAL HIGH (ref 0–44)
AST: 107 U/L — ABNORMAL HIGH (ref 15–41)
Albumin: 4.1 g/dL (ref 3.5–5.0)
Alkaline Phosphatase: 91 U/L (ref 38–126)
Anion gap: 9 (ref 5–15)
BUN: 12 mg/dL (ref 6–20)
CO2: 28 mmol/L (ref 22–32)
Calcium: 9.4 mg/dL (ref 8.9–10.3)
Chloride: 98 mmol/L (ref 98–111)
Creatinine, Ser: 0.67 mg/dL (ref 0.44–1.00)
GFR calc Af Amer: >60 mL/min (ref >60)
GFR calc non Af Amer: >60 mL/min (ref >60)
Glucose, Bld: 132 mg/dL — ABNORMAL HIGH (ref 70–99)
Potassium: 4 mmol/L (ref 3.5–5.1)
Sodium: 135 mmol/L (ref 135–145)
Total Bilirubin: 0.7 mg/dL (ref 0.3–1.2)
Total Protein: 7.5 g/dL (ref 6.5–8.1)

## 2019-07-31 LAB — VITAMIN B12: Vitamin B-12: 330 pg/mL (ref 180–914)

## 2019-07-31 LAB — FOLATE: Folate: 7.2 ng/mL (ref >5.9)

## 2019-07-31 LAB — SEDIMENTATION RATE: Sed Rate: 31 mm/hr — ABNORMAL HIGH (ref 0–22)

## 2019-07-31 LAB — IRON AND TIBC
Iron: 89 ug/dL (ref 28–170)
Saturation Ratios: 24 % (ref 10.4–31.8)
TIBC: 373 ug/dL (ref 250–450)
UIBC: 284 ug/dL

## 2019-07-31 LAB — FERRITIN: Ferritin: 561 ng/mL — ABNORMAL HIGH (ref 11–307)

## 2019-07-31 LAB — C-REACTIVE PROTEIN: CRP: 1.3 mg/dL — ABNORMAL HIGH (ref <1.0)

## 2019-07-31 LAB — LACTATE DEHYDROGENASE: LDH: 250 U/L — ABNORMAL HIGH (ref 98–192)

## 2019-07-31 LAB — VITAMIN D 25 HYDROXY (VIT D DEFICIENCY, FRACTURES): Vit D, 25-Hydroxy: 24.37 ng/mL — ABNORMAL LOW (ref 30–100)

## 2019-07-31 NOTE — Assessment & Plan Note (Addendum)
1.  H63D heterozygosity for hemochromatosis: -She was found to have elevated liver enzymes and a ferritin level of 963, percent saturation 26 on 07/15/2019. -Hepatitis B and C serology was negative. 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  C282Y was negative.  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 -CT scan of the abdomen and pelvis on 07/02/2019 showed decreased attenuation in the liver consistent with fatty liver.  Spleen size was normal. -No family history of hemochromatosis.  Maternal uncle had cirrhosis from drinking alcohol.  Family history positive for liver cancer and lung cancer.  She drinks beer over the weekend. -She has some problems swallowing, bloating sensation and decreased appetite for the last several months and is scheduled for an EGD end of March.  No weight loss reported. -Elevated ferritin could be from combination of fatty liver and inflammation from rheumatoid arthritis. -We will check a CBC and repeat ferritin and iron panel.  We will also check CRP and ESR.  2.  Hypercalcemia: -She has mild hypercalcemia with calcium level 10.7. -We will check intact PTH.  3.  Rheumatoid arthritis: -This is affecting her small joints of the hands and elbows. -She follows up with Dr. Scarlette Shorts in [place].  She was reportedly started on sulfasalazine 100 mg twice daily and prednisone 10 mg once daily about 6 to 7 months ago. -She has not noticed much improvement in her joint pains.  She was told to follow-up with Dr. Scarlette Shorts.

## 2019-07-31 NOTE — Patient Instructions (Addendum)
[place] at [facility]  Discharge Instructions: Follow up in 2-3 weeks   You saw Dr. Delton Coombes and Francene Finders, NP, today. _______________________________________________________________  Thank you for choosing [place] at [facility] to provide your oncology and hematology care.  To afford each patient quality time with our providers, please arrive at least 15 minutes before your scheduled appointment.  You need to re-schedule your appointment if you arrive 10 or more minutes late.  We strive to give you quality time with our providers, and arriving late affects you and other patients whose appointments are after yours.  Also, if you no show three or more times for appointments you may be dismissed from the clinic.  Again, thank you for choosing [facility] hope is that these requests will allow you access to exceptional care and in a timely manner. _______________________________________________________________  If you have questions after your visit, please contact our office at [phone] between the hours of 8:30 a.m. and 5:00 p.m. Voicemails left after 4:30 p.m. will not be returned until the following business day. _______________________________________________________________  For prescription refill requests, have your pharmacy contact our office. _______________________________________________________________  Recommendations made by the consultant and any test results will be sent to your referring physician. _______________________________________________________________

## 2019-07-31 NOTE — Progress Notes (Signed)
CONSULT NOTE  Patient Care Team: Marylee Floras, FNP as PCP - General (Family Medicine) Harl Bowie, Alphonse Guild, MD as PCP - Cardiology (Cardiology) Gala Romney Cristopher Estimable, MD as Consulting Physician (Gastroenterology)  CHIEF COMPLAINTS/PURPOSE OF CONSULTATION: Elevated ferritin  HISTORY OF PRESENTING ILLNESS:  Dorna Bloom 54 y.o. female was sent here by her PCP for elevated ferritin.  Patient reports this is the first time she has heard that her ferritin was elevated.  She denies a family history of MI chroma ptosis or elevated ferritin or phlebotomies.  Patient recently was told she had a fatty liver.  She reports that her diet has not the best.  She does also report alcohol intake on weekends.  She also reports that she cooks most of her food and cast iron pans.  She denies any supplement usage such as iron pills, vitamin C, vitamin D or any other over-the-counter vitamins.  She denies any raw fish or shellfish intake.  She denies any blood transfusions.  Patient reports she donated blood regularly in the past.  She has not donated in the past year.  Patient reports she is having increased fatigue decreased appetite due to early satiety and trouble swallowing solid foods.  She is also having frequent headaches.  She was referred to GI and they are doing an upper endoscopy at the end of March.  Patient denies any recent chest pain on exertion, shortness of breath on minimal exertion, presyncopal episodes or palpitations.  She has not noticed any recent bleeding such as epistasis, hematuria or hematochezia.  Patient denies any prior history of cancer or cancer diagnosis.  All of her age-appropriate screenings are up-to-date. Patient has a family history of maternal aunt with liver cancer and a maternal great grandmother with lung cancer.   MEDICAL HISTORY:  Past Medical History:  Diagnosis Date  . Anxiety   . Chest pain   . Depression   . Diabetes mellitus without complication ([place])   .  Diverticulitis   . Hypercholesterolemia   . Hypertension   . Lower back pain   . Rheumatoid arthritis ([place])   . Sleep apnea    on CPAP    SURGICAL HISTORY: Past Surgical History:  Procedure Laterality Date  . ABDOMINAL HYSTERECTOMY    . CHOLECYSTECTOMY    . COLONOSCOPY    . ESOPHAGOGASTRODUODENOSCOPY  12/21/2016   [facility]; normal esophagus, small hiatal hernia, nonbleeding erosions in the gastric antrum/B biopsied, normal pylorus, normal duodenum.  Pathology with chronic nonspecific gastritis, H. pylori negative.  . OTHER SURGICAL HISTORY     HYSTERECTOMY 06/27/1997  . REPLACEMENT TOTAL KNEE  1/1/192013   LEFT KNEE JOINT     SOCIAL HISTORY: Social History   Socioeconomic History  . Marital status: Married    Spouse name: Not on file  . Number of children: 2  . Years of education: Not on file  . Highest education level: Not on file  Occupational History    Comment: [employer]  Tobacco Use  . Smoking status: Current Some Day Smoker    Types: Cigarettes    Last attempt to quit: 06/28/2003    Years since quitting: 16.1  . Smokeless tobacco: Never Used  . Tobacco comment: does e-ciggs now   Substance and Sexual Activity  . Alcohol use: Yes    Comment: socially; 8-9 beer on the weekend  . Drug use: Not Currently  . Sexual activity: Yes  Other Topics Concern  . Not on file  Social  History Narrative  . Not on file   Social Determinants of Health   Financial Resource Strain:   . Difficulty of Paying Living Expenses: Not on file  Food Insecurity:   . Worried About Charity fundraiser in the Last Year: Not on file  . Ran Out of Food in the Last Year: Not on file  Transportation Needs:   . Lack of Transportation (Medical): Not on file  . Lack of Transportation (Non-Medical): Not on file  Physical Activity:   . Days of Exercise per Week: Not on file  . Minutes of Exercise per Session: Not on file  Stress:   . Feeling of Stress : Not on file   Social Connections:   . Frequency of Communication with Friends and Family: Not on file  . Frequency of Social Gatherings with Friends and Family: Not on file  . Attends Religious Services: Not on file  . Active Member of Clubs or Organizations: Not on file  . Attends Archivist Meetings: Not on file  . Marital Status: Not on file  Intimate Partner Violence:   . Fear of Current or Ex-Partner: Not on file  . Emotionally Abused: Not on file  . Physically Abused: Not on file  . Sexually Abused: Not on file    FAMILY HISTORY: Family History  Problem Relation Age of Onset  . Hypertension Mother   . Fibromyalgia Mother   . Anxiety disorder Mother   . Parkinson's disease Mother   . Hypertension Father   . Diabetes Father   . Heart disease Father   . Coronary artery disease Father   . Parkinson's disease Maternal Uncle   . Parkinson's disease Maternal Grandmother   . Alzheimer's disease Paternal Grandmother   . Sleep apnea Son   . Colon cancer Neg Hx     ALLERGIES:  has No Known Allergies.  MEDICATIONS:  Current Outpatient Medications  Medication Sig Dispense Refill  . famotidine (PEPCID) 20 MG tablet Take 20 mg by mouth daily.    Marland Kitchen lisinopril (ZESTRIL) 40 MG tablet Take 40 mg by mouth daily.    . clonazePAM (KLONOPIN) 0.25 MG disintegrating tablet clonazepam 0.25 mg disintegrating tablet  DISSOLVE 1 TABLET IN MOUTH TWICE DAILY AS NEEDED FOR ANXIETY ATTACKS FOR 14 DAYS    . ketoconazole (NIZORAL) 2 % cream Apply 1 application topically as needed.     . lansoprazole (PREVACID) 30 MG capsule lansoprazole 30 mg capsule,delayed release  TAKE 1 CAPSULE BY MOUTH EVERY DAY IN THE MORNING    . metFORMIN (GLUCOPHAGE) 500 MG tablet Take by mouth 2 (two) times daily with a meal.    . ondansetron (ZOFRAN) 4 MG tablet Take 1 tablet (4 mg total) by mouth every 8 (eight) hours as needed for nausea or vomiting. (Patient not taking: Reported on 07/31/2019) 30 tablet 1  . predniSONE  (DELTASONE) 10 MG tablet Take 1 tablet by mouth daily.    Marland Kitchen sulfaSALAzine (AZULFIDINE) 500 MG tablet Take 1 tablet by mouth 2 (two) times daily.     No current facility-administered medications for this visit.    REVIEW OF SYSTEMS:   Constitutional: Denies fevers, chills or abnormal night sweats. + Headaches + fatigue Respiratory: Denies cough, dyspnea or wheezes Cardiovascular: Denies palpitation, chest discomfort or lower extremity swelling Gastrointestinal:  Denies heartburn or change in bowel habits. + Nausea Skin: Denies abnormal skin rashes Lymphatics: Denies new lymphadenopathy or easy bruising Neurological:Denies numbness, tingling or new weaknesses Behavioral/Psych: Mood is stable, no  new changes  All other systems were reviewed with the patient and are negative.  PHYSICAL EXAMINATION: ECOG PERFORMANCE STATUS: 1 - Symptomatic but completely ambulatory  Vitals:   07/31/19 1004  BP: (!) 116/53  Pulse: 83  Resp: 16  Temp: (!) 97.1 F (36.2 C)  SpO2: 99%   Filed Weights   07/31/19 1004  Weight: 275 lb (124.7 kg)    GENERAL:alert, no distress and comfortable SKIN: skin color, texture, turgor are normal, no rashes or significant lesions NECK: supple, thyroid normal size, non-tender, without nodularity LYMPH:  no palpable lymphadenopathy in the cervical, axillary or inguinal LUNGS: clear to auscultation and percussion with normal breathing effort HEART: regular rate & rhythm and no murmurs and no lower extremity edema ABDOMEN:abdomen soft, non-tender and normal bowel sounds Musculoskeletal:no cyanosis of digits and no clubbing  PSYCH: alert & oriented x 3 with fluent speech NEURO: no focal motor/sensory deficits  LABORATORY DATA:  I have reviewed the data as listed Recent Results (from the past 2160 hour(s))  Comprehensive Metabolic Panel (CMET)     Status: Abnormal   Collection Time: 07/15/19  2:33 PM  Result Value Ref Range   Glucose, Bld 107 65 - 139 mg/dL     Comment: .        Non-fasting reference interval .    BUN 9 7 - 25 mg/dL   Creat 0.66 0.50 - 1.05 mg/dL    Comment: For patients >54 years of age, the reference limit for Creatinine is approximately 13% higher for people identified as African-American. .    BUN/Creatinine Ratio NOT APPLICABLE 6 - 22 (calc)   Sodium 138 135 - 146 mmol/L   Potassium 4.7 3.5 - 5.3 mmol/L   Chloride 100 98 - 110 mmol/L   CO2 30 20 - 32 mmol/L   Calcium 10.7 (H) 8.6 - 10.4 mg/dL   Total Protein 7.6 6.1 - 8.1 g/dL   Albumin 4.4 3.6 - 5.1 g/dL   Globulin 3.2 1.9 - 3.7 g/dL (calc)   AG Ratio 1.4 1.0 - 2.5 (calc)   Total Bilirubin 0.5 0.2 - 1.2 mg/dL   Alkaline phosphatase (APISO) 97 37 - 153 U/L   AST 151 (H) 10 - 35 U/L   ALT 129 (H) 6 - 29 U/L  Hepatitis A Ab, Total     Status: None   Collection Time: 07/15/19  2:33 PM  Result Value Ref Range   Hepatitis A AB,Total NON-REACTIVE NON-REACTI    Comment: . For additional information, please refer to  http://education.questdiagnostics.com/faq/FAQ202  (This link is being provided for informational/ educational purposes only.) .   Hepatitis B Core Antibody, total     Status: None   Collection Time: 07/15/19  2:33 PM  Result Value Ref Range   Hep B Core Total Ab NON-REACTIVE NON-REACTI  Hepatitis B Surface AntiBODY     Status: None   Collection Time: 07/15/19  2:33 PM  Result Value Ref Range   Hep B S Ab NON-REACTIVE NON-REACTI  Hepatitis B Surface AntiGEN     Status: None   Collection Time: 07/15/19  2:33 PM  Result Value Ref Range   Hepatitis B Surface Ag NON-REACTIVE NON-REACTI  Hepatitis C Antibody     Status: None   Collection Time: 07/15/19  2:33 PM  Result Value Ref Range   Hepatitis C Ab NON-REACTIVE NON-REACTI   SIGNAL TO CUT-OFF 0.01 <1.00    Comment: . HCV antibody was non-reactive. There is no laboratory  evidence of HCV  infection. . In most cases, no further action is required. However, if recent HCV exposure is suspected, a  test for HCV RNA (test code 469 [id number]) is suggested. . For additional information please refer to http://education.questdiagnostics.com/faq/FAQ22v1 (This link is being provided for informational/ educational purposes only.) .   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 Interpretation: One copy of the H63D pathogenic variant in the HFE gene was detected. This patient is negative for the C282Y pathogenic variant. In the absence of evidence of iron overload, this result most likely indicates that this individual is an HFE carrier. This result reduces the likelihood of hereditary hemochromatosis ([place]). However, it does not rule out the presence of other pathogenic variants within the HFE gene or a diagnosis of HH. The risk of this individual to carry an HFE pathogenic variant other than those tested in this assay depends greatly on family and clinical history as well as ethnicity. This assay does not test for other primary or secondary iron overload disorders. Consider genetic counseling and DNA testing for at-risk family members. . Laboratory results and submitted clinical information reviewed by Gerilyn Nestle, MD, PhD, [facility], CGMBS. Marland Kitchen DETAILED ASSAY INFORMATION : Hereditary hemochromatosis (HH) is an autosomal recessive disorder of iron metabolism that can result in iron overload and potential organ failure. It is one of the most common genetic disorders in individuals of European-Caucasian ancestry, with an estimated carrier frequency of 10%. HH is caused by pathogenic  variants in the HFE gene. Most individuals with HH (60-90%) are homozygous for the C282Y pathogenic variant. A smaller percentage of affected individuals are either compound heterozygous for the C282Y and H63D pathogenic variants (3%-8%), or homozygous for the H63D pathogenic variant (approximately 1%). . METHODOLOGY: This assay detects two pathogenic variants in the HFE gene, C282Y (NM 767341.9: c.845G>A, p.Cys282Tyr) and H63D (NM 379024.0: c.187C>G, p.His63Asp), that are commonly associated with HH. These variants are detected by multiplex-polymerase chain reaction (PCR) amplification, followed by restriction enzyme digestion and capillary electrophoresis.  Marland Kitchen LIMITATIONS: This assay does not detect other pathogenic variants in the HFE gene that may be associated with HH. Although rare, false positive or false negative results may occur. All results should be interpreted in the context of clinical findings, relevant history, and other laboratory data. . Health care providers, please contact your local [place] genetic counselor or call [phone] [phone]) for assistance with the interpretation of these results. . This test was developed and its analytical performance characteristics have been determined by [facility]. It has not been cleared or approved by FDA. This assay has been validated pursuant to the CLIA regulations and is used for clinical purposes. . For more information, please refer to http://education.questdiagnostics.com/faq/hemochromatos (This link is being provided for informational/educational purposes only.)   Hepatic function panel     Status: Abnormal   Collection Time: 07/17/19  4:07 PM  Result Value Ref Range   Total Protein 7.0 6.1 - 8.1  g/dL   Albumin 4.2 3.6 - 5.1 g/dL   Globulin 2.8 1.9 - 3.7 g/dL (calc)   AG Ratio 1.5 1.0 - 2.5 (calc)   Total Bilirubin 0.6 0.2 - 1.2 mg/dL    Bilirubin, Direct 0.1 0.0 - 0.2 mg/dL   Indirect Bilirubin 0.5 0.2 - 1.2 mg/dL (calc)   Alkaline phosphatase (APISO) 94 37 - 153 U/L   AST 102 (H) 10 - 35 U/L   ALT 108 (H) 6 - 29 U/L  Sedimentation rate     Status: Abnormal   Collection Time: 07/31/19 12:31 PM  Result Value Ref Range   Sed Rate 31 (H) 0 - 22 mm/hr    Comment: Performed at [facility], [address] [place], [place] [address]  C-reactive protein     Status: Abnormal   Collection Time: 07/31/19 12:31 PM  Result Value Ref Range   CRP 1.3 (H) <1.0 mg/dL    Comment: Performed at [facility], [address] [place], [place] [address]  Folate     Status: None   Collection Time: 07/31/19 12:31 PM  Result Value Ref Range   Folate 7.2 >5.9 ng/mL    Comment: RESULTS CONFIRMED BY MANUAL DILUTION Performed at [facility], [address], [place], [place] [address] CORRECTED ON 02/03 AT 1440: PREVIOUSLY REPORTED AS 8.3   VITAMIN D 25 Hydroxy (Vit-D Deficiency, Fractures)     Status: Abnormal   Collection Time: 07/31/19 12:31 PM  Result Value Ref Range   Vit D, 25-Hydroxy 24.37 (L) 30 - 100 ng/mL    Comment: (NOTE) Vitamin D deficiency has been defined by the [place] practice guideline as a level of serum 25-OH  vitamin D less than 20 ng/mL (1,2). The Endocrine Society went on to  further define vitamin D insufficiency as a level between 21 and 29  ng/mL (2). 1. IOM (Institute of Medicine). 2010. Dietary reference intakes for  calcium and D. [place]: [employer]. 2. Holick MF, Bink[employer], Bischoff-Ferrari HA, et al. Evaluation,  treatment, and prevention of vitamin D deficiency: an Endocrine  Society clinical practice guideline, JCEM. 2011 Jul; 96(7): 1911-30. Performed at [facility], [place] [address], [place], [place] [address]   Vitamin B12     Status: None   Collection Time: 07/31/19 12:31 PM  Result Value Ref Range   Vitamin B-12 330 180 - 914  pg/mL    Comment: (NOTE) This assay is not validated for testing neonatal or myeloproliferative syndrome specimens for Vitamin B12 levels. Performed at [facility], [address], [place], [place] [address]   Iron and TIBC     Status: None   Collection Time: 07/31/19 12:31 PM  Result Value Ref Range   Iron 89 28 - 170 ug/dL   TIBC 373 250 - 450 ug/dL   Saturation Ratios 24 10.4 - 31.8 %   UIBC 284 ug/dL    Comment: Performed at [facility], [address] [place],  [address]  Ferritin     Status: Abnormal   Collection Time: 07/31/19 12:31 PM  Result Value Ref Range   Ferritin 561 (H) 11 - 307 ng/mL    Comment: Performed at [facility], [address], [place],  [address]  Comprehensive metabolic panel     Status: Abnormal   Collection Time: 07/31/19 12:31 PM  Result Value Ref Range   Sodium 135 135 - 145 mmol/L   Potassium 4.0 3.5 -  5.1 mmol/L   Chloride 98 98 - 111 mmol/L   CO2 28 22 - 32 mmol/L   Glucose, Bld 132 (H) 70 - 99 mg/dL   BUN 12 6 - 20 mg/dL   Creatinine, Ser 0.67 0.44 - 1.00 mg/dL   Calcium 9.4 8.9 - 10.3 mg/dL   Total Protein 7.5 6.5 - 8.1 g/dL   Albumin 4.1 3.5 - 5.0 g/dL   AST 107 (H) 15 - 41 U/L   ALT 106 (H) 0 - 44 U/L   Alkaline Phosphatase 91 38 - 126 U/L   Total Bilirubin 0.7 0.3 - 1.2 mg/dL   GFR calc non Af Amer >60 >60 mL/min   GFR calc Af Amer >60 >60 mL/min   Anion gap 9 5 - 15    Comment: Performed at [facility], [address], [place], [place] [address]  CBC with Differential/Platelet     Status: Abnormal   Collection Time: 07/31/19 12:31 PM  Result Value Ref Range   WBC 9.6 4.0 - 10.5 K/uL   RBC 4.89 3.87 - 5.11 MIL/uL   Hemoglobin 15.2 (H) 12.0 - 15.0 g/dL   HCT 46.5 (H) 36.0 - 46.0 %   MCV 95.1 80.0 - 100.0 fL   MCH 31.1 26.0 - 34.0 pg   MCHC 32.7 30.0 - 36.0 g/dL   RDW 13.0 11.5 - 15.5 %   Platelets 220 150 - 400 K/uL   nRBC 0.0 0.0 - 0.2 %   Neutrophils Relative % 61 %   Neutro Abs 5.9 1.7 - 7.7 K/uL    Lymphocytes Relative 28 %   Lymphs Abs 2.7 0.7 - 4.0 K/uL   Monocytes Relative 7 %   Monocytes Absolute 0.6 0.1 - 1.0 K/uL   Eosinophils Relative 2 %   Eosinophils Absolute 0.2 0.0 - 0.5 K/uL   Basophils Relative 1 %   Basophils Absolute 0.1 0.0 - 0.1 K/uL   Immature Granulocytes 1 %   Abs Immature Granulocytes 0.09 (H) 0.00 - 0.07 K/uL    Comment: Performed at [facility], [address], [place], [place] [address]  Lactate dehydrogenase     Status: Abnormal   Collection Time: 07/31/19 12:31 PM  Result Value Ref Range   LDH 250 (H) 98 - 192 U/L    Comment: Performed at [facility], [address], [place], [place] [address]    RADIOGRAPHIC STUDIES: I have personally reviewed the radiological images as listed and agreed with the findings in the report. I have independently examined and interviewed this patient.  I agree with the HPI report provided by my nurse practitioner Wenda Low, FNP.  I have independently formulated my assessment and plan.  ASSESSMENT & PLAN:  Elevated ferritin level 1.  H63D heterozygosity for hemochromatosis: -She was found to have elevated liver enzymes and a ferritin level of 963, percent saturation 26 on 07/15/2019. -Hepatitis B and C serology was negative. 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  C282Y was negative.  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 -CT scan of the abdomen and pelvis on 07/02/2019 showed decreased attenuation in the liver consistent with fatty liver.  Spleen size was normal. -No family history of hemochromatosis.  Maternal uncle had cirrhosis from drinking alcohol.  Family history positive for liver cancer and lung cancer.  She drinks beer over the weekend. -She has some problems swallowing, bloating sensation and decreased appetite for the last several months and is scheduled for  an EGD end of March.  No weight loss reported. -Elevated ferritin could be from combination of fatty  liver and inflammation from rheumatoid arthritis. -We will check a CBC and repeat ferritin and iron panel.  We will also check CRP and ESR.  2.  Hypercalcemia: -She has mild hypercalcemia with calcium level 10.7. -We will check intact PTH.  3.  Rheumatoid arthritis: -This is affecting her small joints of the hands and elbows. -She follows up with Dr. Scarlette Shorts in [place].  She was reportedly started on sulfasalazine 100 mg twice daily and prednisone 10 mg once daily about 6 to 7 months ago. -She has not noticed much improvement in her joint pains.  She was told to follow-up with Dr. Scarlette Shorts.     All questions were answered. The patient knows to call the clinic with any problems, questions or concerns.      Derek Jack, MD 07/31/19 6:50 PM

## 2019-08-01 LAB — RHEUMATOID FACTOR: Rheumatoid fact SerPl-aCnc: 12.5 IU/mL (ref 0.0–13.9)

## 2019-08-01 LAB — PTH, INTACT AND CALCIUM
Calcium, Total (PTH): 10 mg/dL (ref 8.7–10.2)
PTH: 25 pg/mL (ref 15–65)

## 2019-08-22 ENCOUNTER — Other Ambulatory Visit (HOSPITAL_COMMUNITY): Payer: Self-pay | Admitting: *Deleted

## 2019-08-22 ENCOUNTER — Inpatient Hospital Stay (HOSPITAL_BASED_OUTPATIENT_CLINIC_OR_DEPARTMENT_OTHER): Payer: PRIVATE HEALTH INSURANCE | Admitting: Hematology

## 2019-08-22 ENCOUNTER — Encounter (HOSPITAL_COMMUNITY): Payer: Self-pay | Admitting: Hematology

## 2019-08-22 DIAGNOSIS — R7989 Other specified abnormal findings of blood chemistry: Secondary | ICD-10-CM

## 2019-08-22 NOTE — Progress Notes (Signed)
Virtual Visit via Telephone Note  I connected with Teresa Mccoy on 08/22/19 at  3:20 PM EST by telephone and verified that I am speaking with the correct person using two identifiers.   I discussed the limitations, risks, security and privacy concerns of performing an evaluation and management service by telephone and the availability of in person appointments. I also discussed with the patient that there may be a patient responsible charge related to this service. The patient expressed understanding and agreed to proceed.   History of Present Illness: She was seen in our clinic for evaluation of elevated ferritin and H63D heterozygosity for hemochromatosis.  This was found on work-up for elevated liver enzymes.  Hepatitis B and C serology was negative.  CT of the abdomen and pelvis on 07/02/2019 showed fatty liver.  Spleen size was normal.  No family history of hemochromatosis.  She drinks beer over the weekend.  She does have some problems swallowing, bloating sensation and decreased appetite for the last several months and is scheduled for EGD end of March.  No weight loss reported.   Observations/Objective: She reports decreased appetite of 25%.  Energy levels are low.  She has occasional abdominal pain.  Chronic nausea and diarrhea are stable.  She occasionally feels a lump in the throat.  Assessment and Plan:  1.  H63D heterozygosity for hemochromatosis: -Ferritin was 963 on 07/15/2019 with percent saturation of 26 with elevated liver enzymes. -We reviewed labs from 07/31/2019.  Ferritin improved to 561.  Percent saturation is 24.  ESR and CRP are elevated. -AST is mildly improved to 107 and ALT to 106.  Hepatitis B and C serology was negative. -Hemoglobin is 15.2 and hematocrit is 46.5. -I have recommended follow-up in 3 months with repeat ferritin and iron panel.  Elevated ferritin and percent saturation is partly secondary to inflammation and liver disease from fatty liver.  2.   Rheumatoid arthritis: -This affects her small joints of hands and elbows.  She follows up with Dr. Scarlette Shorts in [place]. -She is reportedly taking sulfasalazine and prednisone which was started about 6 to 7 months ago. -Her ESR and CRP are elevated.  This could also contribute to her elevated ferritin.  3.  Hypercalcemia: -She had a history of hypercalcemia.  Repeat calcium on 07/31/2019 was normal at 9.4. -Vitamin D was 24.37.  She will benefit from taking vitamin D 1000 units daily. -Intact PTH is 25.   Follow Up Instructions: RTC 3 months with labs.   I discussed the assessment and treatment plan with the patient. The patient was provided an opportunity to ask questions and all were answered. The patient agreed with the plan and demonstrated an understanding of the instructions.   The patient was advised to call back or seek an in-person evaluation if the symptoms worsen or if the condition fails to improve as anticipated.  I provided 11 minutes of non-face-to-face time during this encounter.   Derek Jack, MD

## 2019-08-26 ENCOUNTER — Telehealth: Payer: Self-pay | Admitting: *Deleted

## 2019-08-26 NOTE — Telephone Encounter (Signed)
Wells Guiles called back. PA is required. She needed clinicals faxed to her at [phone]. Clinicals faxed. Will await decision

## 2019-08-26 NOTE — Telephone Encounter (Signed)
[facility]ed [employer] [facility] and LMOVM for [employer] to call back

## 2019-08-26 NOTE — Telephone Encounter (Signed)
PA approved. Auth# [id number] DOS 09/23/19

## 2019-09-19 ENCOUNTER — Other Ambulatory Visit: Payer: Self-pay

## 2019-09-19 ENCOUNTER — Encounter (HOSPITAL_COMMUNITY)
Admission: RE | Admit: 2019-09-19 | Discharge: 2019-09-19 | Disposition: A | Discharge status: Home / Self Care | Payer: PRIVATE HEALTH INSURANCE | Source: Ambulatory Visit | Attending: Internal Medicine | Admitting: Internal Medicine

## 2019-09-19 ENCOUNTER — Other Ambulatory Visit (HOSPITAL_COMMUNITY)
Admission: RE | Admit: 2019-09-19 | Discharge: 2019-09-19 | Disposition: A | Discharge status: Home / Self Care | Payer: PRIVATE HEALTH INSURANCE | Source: Ambulatory Visit | Attending: Internal Medicine | Admitting: Internal Medicine

## 2019-09-19 DIAGNOSIS — Z01812 Encounter for preprocedural laboratory examination: Secondary | ICD-10-CM | POA: Insufficient documentation

## 2019-09-19 DIAGNOSIS — Z20822 Contact with and (suspected) exposure to covid-19: Secondary | ICD-10-CM | POA: Insufficient documentation

## 2019-09-19 NOTE — Patient Instructions (Signed)
Teresa Mccoy  09/19/2019     @PREFPERIOPPHARMACY @   Your procedure is scheduled on  09/23/2019 .  Report to [facility] at  [place]  A.M.  Call this number if you have problems the morning of surgery:  [phone]   Remember:  Follow the diet instructions given to you by Dr Roseanne Kaufman office.                 Take these medicines the morning of surgery with A SIP OF WATER  Clonazepam, lansoprazole, lisinopril, zofran.    Do not wear jewelry, make-up or nail polish.  Do not wear lotions, powders, or perfumes. Please wear deodorant and brush your teeth.  Do not shave 48 hours prior to surgery.  Men may shave face and neck.  Do not bring valuables to the hospital.  [facility] is not responsible for any belongings or valuables.  Contacts, dentures or bridgework may not be worn into surgery.  Leave your suitcase in the car.  After surgery it may be brought to your room.  For patients admitted to the hospital, discharge time will be determined by your treatment team.  Patients discharged the day of surgery will not be allowed to drive home.   Name and phone number of your driver:   family Special instructions:  DO NOT smoke the morning of your procedure.  Please read over the following fact sheets that you were given. Anesthesia Post-op Instructions and Care and Recovery After Surgery       Upper Endoscopy, Adult, Care After This sheet gives you information about how to care for yourself after your procedure. Your health care provider may also give you more specific instructions. If you have problems or questions, contact your health care provider. What can I expect after the procedure? After the procedure, it is common to have:  A sore throat.  Mild stomach pain or discomfort.  Bloating.  Nausea. Follow these instructions at home:   Follow instructions from your health care provider about what to eat or drink after your procedure.  Return to your normal  activities as told by your health care provider. Ask your health care provider what activities are safe for you.  Take over-the-counter and prescription medicines only as told by your health care provider.  Do not drive for 24 hours if you were given a sedative during your procedure.  Keep all follow-up visits as told by your health care provider. This is important. Contact a health care provider if you have:  A sore throat that lasts longer than one day.  Trouble swallowing. Get help right away if:  You vomit blood or your vomit looks like coffee grounds.  You have: ? A fever. ? Bloody, black, or tarry stools. ? A severe sore throat or you cannot swallow. ? Difficulty breathing. ? Severe pain in your chest or abdomen. Summary  After the procedure, it is common to have a sore throat, mild stomach discomfort, bloating, and nausea.  Do not drive for 24 hours if you were given a sedative during the procedure.  Follow instructions from your health care provider about what to eat or drink after your procedure.  Return to your normal activities as told by your health care provider. This information is not intended to replace advice given to you by your health care provider. Make sure you discuss any questions you have with your health care provider. Document Revised: 12/05/2017 Document Reviewed: 11/13/2017 Elsevier  Patient Education  2020 [place] After These instructions provide you with information about caring for yourself after your procedure. Your health care provider may also give you more specific instructions. Your treatment has been planned according to current medical practices, but problems sometimes occur. Call your health care provider if you have any problems or questions after your procedure. What can I expect after the procedure? After your procedure, you may:  Feel sleepy for several hours.  Feel clumsy and have poor balance  for several hours.  Feel forgetful about what happened after the procedure.  Have poor judgment for several hours.  Feel nauseous or vomit.  Have a sore throat if you had a breathing tube during the procedure. Follow these instructions at home: For at least 24 hours after the procedure:      Have a responsible adult stay with you. It is important to have someone help care for you until you are awake and alert.  Rest as needed.  Do not: ? Participate in activities in which you could fall or become injured. ? Drive. ? Use heavy machinery. ? Drink alcohol. ? Take sleeping pills or medicines that cause drowsiness. ? Make important decisions or sign legal documents. ? Take care of children on your own. Eating and drinking  Follow the diet that is recommended by your health care provider.  If you vomit, drink water, juice, or soup when you can drink without vomiting.  Make sure you have little or no nausea before eating solid foods. General instructions  Take over-the-counter and prescription medicines only as told by your health care provider.  If you have sleep apnea, surgery and certain medicines can increase your risk for breathing problems. Follow instructions from your health care provider about wearing your sleep device: ? Anytime you are sleeping, including during daytime naps. ? While taking prescription pain medicines, sleeping medicines, or medicines that make you drowsy.  If you smoke, do not smoke without supervision.  Keep all follow-up visits as told by your health care provider. This is important. Contact a health care provider if:  You keep feeling nauseous or you keep vomiting.  You feel light-headed.  You develop a rash.  You have a fever. Get help right away if:  You have trouble breathing. Summary  For several hours after your procedure, you may feel sleepy and have poor judgment.  Have a responsible adult stay with you for at least 24 hours  or until you are awake and alert. This information is not intended to replace advice given to you by your health care provider. Make sure you discuss any questions you have with your health care provider. Document Revised: 09/11/2017 Document Reviewed: 10/04/2015 Elsevier Patient Education  [place].

## 2019-09-20 ENCOUNTER — Other Ambulatory Visit (HOSPITAL_COMMUNITY): Payer: PRIVATE HEALTH INSURANCE

## 2019-09-20 ENCOUNTER — Encounter (HOSPITAL_COMMUNITY): Payer: PRIVATE HEALTH INSURANCE

## 2019-09-20 LAB — SARS CORONAVIRUS 2 (TAT 6-24 HRS): SARS Coronavirus 2: NEGATIVE

## 2019-09-23 ENCOUNTER — Ambulatory Visit (HOSPITAL_COMMUNITY): Payer: PRIVATE HEALTH INSURANCE | Admitting: Anesthesiology

## 2019-09-23 ENCOUNTER — Encounter: Payer: Self-pay | Admitting: Internal Medicine

## 2019-09-23 ENCOUNTER — Ambulatory Visit (HOSPITAL_COMMUNITY)
Admission: RE | Admit: 2019-09-23 | Discharge: 2019-09-23 | Disposition: A | Discharge status: Home / Self Care | Payer: PRIVATE HEALTH INSURANCE | Attending: Internal Medicine | Admitting: Internal Medicine

## 2019-09-23 ENCOUNTER — Encounter (HOSPITAL_COMMUNITY): Payer: Self-pay | Admitting: Internal Medicine

## 2019-09-23 ENCOUNTER — Encounter (HOSPITAL_COMMUNITY)
Admission: RE | Disposition: A | Discharge status: Home / Self Care | Payer: Self-pay | Source: Home / Self Care | Attending: Internal Medicine

## 2019-09-23 DIAGNOSIS — I10 Essential (primary) hypertension: Secondary | ICD-10-CM | POA: Insufficient documentation

## 2019-09-23 DIAGNOSIS — G4730 Sleep apnea, unspecified: Secondary | ICD-10-CM | POA: Diagnosis not present

## 2019-09-23 DIAGNOSIS — R140 Abdominal distension (gaseous): Secondary | ICD-10-CM

## 2019-09-23 DIAGNOSIS — D132 Benign neoplasm of duodenum: Secondary | ICD-10-CM | POA: Insufficient documentation

## 2019-09-23 DIAGNOSIS — F419 Anxiety disorder, unspecified: Secondary | ICD-10-CM | POA: Diagnosis not present

## 2019-09-23 DIAGNOSIS — Z7952 Long term (current) use of systemic steroids: Secondary | ICD-10-CM | POA: Insufficient documentation

## 2019-09-23 DIAGNOSIS — R6881 Early satiety: Secondary | ICD-10-CM

## 2019-09-23 DIAGNOSIS — Z8719 Personal history of other diseases of the digestive system: Secondary | ICD-10-CM | POA: Diagnosis not present

## 2019-09-23 DIAGNOSIS — Z79899 Other long term (current) drug therapy: Secondary | ICD-10-CM | POA: Insufficient documentation

## 2019-09-23 DIAGNOSIS — M069 Rheumatoid arthritis, unspecified: Secondary | ICD-10-CM | POA: Diagnosis not present

## 2019-09-23 DIAGNOSIS — Z8249 Family history of ischemic heart disease and other diseases of the circulatory system: Secondary | ICD-10-CM | POA: Insufficient documentation

## 2019-09-23 DIAGNOSIS — R1310 Dysphagia, unspecified: Secondary | ICD-10-CM | POA: Insufficient documentation

## 2019-09-23 DIAGNOSIS — F17210 Nicotine dependence, cigarettes, uncomplicated: Secondary | ICD-10-CM | POA: Insufficient documentation

## 2019-09-23 DIAGNOSIS — Z7984 Long term (current) use of oral hypoglycemic drugs: Secondary | ICD-10-CM | POA: Insufficient documentation

## 2019-09-23 DIAGNOSIS — E119 Type 2 diabetes mellitus without complications: Secondary | ICD-10-CM | POA: Insufficient documentation

## 2019-09-23 DIAGNOSIS — Z833 Family history of diabetes mellitus: Secondary | ICD-10-CM | POA: Diagnosis not present

## 2019-09-23 HISTORY — PX: BIOPSY: SHX5522

## 2019-09-23 HISTORY — PX: ESOPHAGOGASTRODUODENOSCOPY (EGD) WITH PROPOFOL: SHX5813

## 2019-09-23 LAB — GLUCOSE, CAPILLARY
Glucose-Capillary: 110 mg/dL — ABNORMAL HIGH (ref 70–99)
Glucose-Capillary: 105 mg/dL — ABNORMAL HIGH (ref 70–99)

## 2019-09-23 SURGERY — ESOPHAGOGASTRODUODENOSCOPY (EGD) WITH PROPOFOL
Anesthesia: General

## 2019-09-23 MED ORDER — PROPOFOL 10 MG/ML IV BOLUS
INTRAVENOUS | Status: DC | PRN
  Administered 2019-09-23: 20 mg via INTRAVENOUS

## 2019-09-23 MED ORDER — LIDOCAINE 2% (20 MG/ML) 5 ML SYRINGE
INTRAMUSCULAR | Status: AC
  Filled 2019-09-23: qty 5

## 2019-09-23 MED ORDER — CHLORHEXIDINE GLUCONATE CLOTH 2 % EX PADS: 6.0000 | MEDICATED_PAD | Freq: Once | CUTANEOUS | Status: DC

## 2019-09-23 MED ORDER — LACTATED RINGERS IV SOLN
Freq: Once | INTRAVENOUS | Status: AC
  Administered 2019-09-23: 1000 mL via INTRAVENOUS

## 2019-09-23 MED ORDER — KETAMINE HCL 50 MG/5ML IJ SOSY
PREFILLED_SYRINGE | INTRAMUSCULAR | Status: AC
  Filled 2019-09-23: qty 5

## 2019-09-23 MED ORDER — GLYCOPYRROLATE 0.2 MG/ML IJ SOLN
INTRAMUSCULAR | Status: DC | PRN
  Administered 2019-09-23: .1 mg via INTRAVENOUS

## 2019-09-23 MED ORDER — LACTATED RINGERS IV SOLN: INTRAVENOUS | Status: DC | PRN

## 2019-09-23 MED ORDER — PROPOFOL 500 MG/50ML IV EMUL
INTRAVENOUS | Status: DC | PRN
  Administered 2019-09-23: 125 ug/kg/min via INTRAVENOUS

## 2019-09-23 MED ORDER — PROPOFOL 10 MG/ML IV BOLUS
INTRAVENOUS | Status: AC
  Filled 2019-09-23: qty 40

## 2019-09-23 MED ORDER — KETAMINE HCL 10 MG/ML IJ SOLN
INTRAMUSCULAR | Status: DC | PRN
  Administered 2019-09-23: 40 mg via INTRAVENOUS

## 2019-09-23 MED ORDER — LIDOCAINE HCL (CARDIAC) PF 100 MG/5ML IV SOSY
PREFILLED_SYRINGE | INTRAVENOUS | Status: DC | PRN
  Administered 2019-09-23: 50 mg via INTRATRACHEAL

## 2019-09-23 MED ORDER — GLYCOPYRROLATE PF 0.2 MG/ML IJ SOSY
PREFILLED_SYRINGE | INTRAMUSCULAR | Status: AC
  Filled 2019-09-23: qty 1

## 2019-09-23 NOTE — Discharge Instructions (Signed)
EGD Discharge instructions Please read the instructions outlined below and refer to this sheet in the next few weeks. These discharge instructions provide you with general information on caring for yourself after you leave the hospital. Your doctor may also give you specific instructions. While your treatment has been planned according to the most current medical practices available, unavoidable complications occasionally occur. If you have any problems or questions after discharge, please call your doctor. ACTIVITY  You may resume your regular activity but move at a slower pace for the next 24 hours.   Take frequent rest periods for the next 24 hours.   Walking will help expel (get rid of) the air and reduce the bloated feeling in your abdomen.   No driving for 24 hours (because of the anesthesia (medicine) used during the test).   You may shower.   Do not sign any important legal documents or operate any machinery for 24 hours (because of the anesthesia used during the test).  NUTRITION  Drink plenty of fluids.   You may resume your normal diet.   Begin with a light meal and progress to your normal diet.   Avoid alcoholic beverages for 24 hours or as instructed by your caregiver.  MEDICATIONS  You may resume your normal medications unless your caregiver tells you otherwise.  WHAT YOU CAN EXPECT TODAY  You may experience abdominal discomfort such as a feeling of fullness or "gas" pains.  FOLLOW-UP  Your doctor will discuss the results of your test with you.  SEEK IMMEDIATE MEDICAL ATTENTION IF ANY OF THE FOLLOWING OCCUR:  Excessive nausea (feeling sick to your stomach) and/or vomiting.   Severe abdominal pain and distention (swelling).   Trouble swallowing.   Temperature over 101 F (37.8 C).   Rectal bleeding or vomiting of blood.   Continue Protonix 40 mg daily; do not take lansoprazole while you are taking Protonix  Continue MiraLAX for constipation  Avoid NSAID  medications like Motrin is much as possible  Schedule a solid-phase gastric emptying study to evaluate how well your stomach is emptying  Further recommendations to follow pending review of pathology report  At patient request, I called him [facility] at [phone]    Monitored Anesthesia Care Anesthesia is a term that refers to techniques, procedures, and medicines that help a person stay safe and comfortable during a medical procedure. Monitored anesthesia care, or sedation, is one type of anesthesia. Your anesthesia specialist may recommend sedation if you will be having a procedure that does not require you to be unconscious, such as:  Cataract surgery.  A dental procedure.  A biopsy.  A colonoscopy. During the procedure, you may receive a medicine to help you relax (sedative). There are three levels of sedation:  Mild sedation. At this level, you may feel awake and relaxed. You will be able to follow directions.  Moderate sedation. At this level, you will be sleepy. You may not remember the procedure.  Deep sedation. At this level, you will be asleep. You will not remember the procedure. The more medicine you are given, the deeper your level of sedation will be. Depending on how you respond to the procedure, the anesthesia specialist may change your level of sedation or the type of anesthesia to fit your needs. An anesthesia specialist will monitor you closely during the procedure. Let your health care provider know about:  Any allergies you have.  All medicines you are taking, including vitamins, herbs, eye drops, creams, and over-the-counter medicines.  Any use of steroids (by mouth or as a cream).  Any problems you or family members have had with sedatives and anesthetic medicines.  Any blood disorders you have.  Any surgeries you have had.  Any medical conditions you have, such as sleep apnea.  Whether you are pregnant or may be pregnant.  Any use of cigarettes,  alcohol, or street drugs. What are the risks? Generally, this is a safe procedure. However, problems may occur, including:  Getting too much medicine (oversedation).  Nausea.  Allergic reaction to medicines.  Trouble breathing. If this happens, a breathing tube may be used to help with breathing. It will be removed when you are awake and breathing on your own.  Heart trouble.  Lung trouble. Before the procedure Staying hydrated Follow instructions from your health care provider about hydration, which may include:  Up to 2 hours before the procedure - you may continue to drink clear liquids, such as water, clear fruit juice, black coffee, and plain tea. Eating and drinking restrictions Follow instructions from your health care provider about eating and drinking, which may include:  8 hours before the procedure - stop eating heavy meals or foods such as meat, fried foods, or fatty foods.  6 hours before the procedure - stop eating light meals or foods, such as toast or cereal.  6 hours before the procedure - stop drinking milk or drinks that contain milk.  2 hours before the procedure - stop drinking clear liquids. Medicines Ask your health care provider about:  Changing or stopping your regular medicines. This is especially important if you are taking diabetes medicines or blood thinners.  Taking medicines such as aspirin and ibuprofen. These medicines can thin your blood. Do not take these medicines before your procedure if your health care provider instructs you not to. Tests and exams  You will have a physical exam.  You may have blood tests done to show: ? How well your kidneys and liver are working. ? How well your blood can clot. General instructions  Plan to have someone take you home from the hospital or clinic.  If you will be going home right after the procedure, plan to have someone with you for 24 hours.  What happens during the procedure?  Your blood  pressure, heart rate, breathing, level of pain and overall condition will be monitored.  An IV tube will be inserted into one of your veins.  Your anesthesia specialist will give you medicines as needed to keep you comfortable during the procedure. This may mean changing the level of sedation.  The procedure will be performed. After the procedure  Your blood pressure, heart rate, breathing rate, and blood oxygen level will be monitored until the medicines you were given have worn off.  Do not drive for 24 hours if you received a sedative.  You may: ? Feel sleepy, clumsy, or nauseous. ? Feel forgetful about what happened after the procedure. ? Have a sore throat if you had a breathing tube during the procedure. ? Vomit. This information is not intended to replace advice given to you by your health care provider. Make sure you discuss any questions you have with your health care provider. Document Revised: 05/26/2017 Document Reviewed: 10/04/2015 Elsevier Patient Education  [place].

## 2019-09-23 NOTE — Anesthesia Preprocedure Evaluation (Addendum)
Anesthesia Evaluation  Patient identified by MRN, date of birth, ID band Patient awake    Reviewed: Allergy & Precautions, NPO status , Patient's Chart, lab work & pertinent test results  History of Anesthesia Complications Negative for: history of anesthetic complications  Airway Mallampati: II  TM Distance: >3 FB Neck ROM: Full    Dental  (+) Teeth Intact, Dental Advisory Given   Pulmonary sleep apnea and Continuous Positive Airway Pressure Ventilation , Current Smoker and Patient abstained from smoking.,    Pulmonary exam normal breath sounds clear to auscultation       Cardiovascular METS: 3 - Mets hypertension, Pt. on medications Normal cardiovascular exam Rhythm:Regular Rate:Normal     Neuro/Psych PSYCHIATRIC DISORDERS Anxiety Depression    GI/Hepatic Neg liver ROS, GERD  Medicated and Controlled,  Endo/Other  diabetes, Well Controlled, Type 2, Oral Hypoglycemic Agents  Renal/GU negative Renal ROS     Musculoskeletal  (+) Arthritis , Osteoarthritis,    Abdominal   Peds  Hematology   Anesthesia Other Findings   Reproductive/Obstetrics                           Anesthesia Physical Anesthesia Plan  ASA: II  Anesthesia Plan: General   Post-op Pain Management:    Induction: Intravenous  PONV Risk Score and Plan: TIVA and Treatment may vary due to age or medical condition  Airway Management Planned: Nasal Cannula, Natural Airway and Simple Face Mask  Additional Equipment:   Intra-op Plan:   Post-operative Plan:   Informed Consent:   Plan Discussed with: CRNA  Anesthesia Plan Comments:         Anesthesia Quick Evaluation

## 2019-09-23 NOTE — Transfer of Care (Signed)
Immediate Anesthesia Transfer of Care Note  Patient: Teresa Mccoy  Procedure(s) Performed: ESOPHAGOGASTRODUODENOSCOPY (EGD) WITH PROPOFOL (N/A ) BIOPSY  Patient Location: PACU  Anesthesia Type:General  Level of Consciousness: awake  Airway & Oxygen Therapy: Patient Spontanous Breathing  Post-op Assessment: Report given to RN and Post -op Vital signs reviewed and stable  Post vital signs: Reviewed and stable  Last Vitals:  Vitals Value Taken Time  BP 117/63 09/23/19 1016  Temp    Pulse 62 09/23/19 1020  Resp 19 09/23/19 1020  SpO2 98 % 09/23/19 1020  Vitals shown include unvalidated device data.  Last Pain:  Vitals:   09/23/19 0958  TempSrc:   PainSc: 0-No pain      Patients Stated Pain Goal: 7 ([phone] [phone])  Complications: No apparent anesthesia complications

## 2019-09-23 NOTE — H&P (Signed)
@LOGO @   Primary Care Physician:  Marylee Floras, FNP Primary Gastroenterologist:  Dr. Gala Romney  Pre-Procedure History & Physical: HPI:  Teresa Mccoy is a 54 y.o. female here for EGD to further evaluate bloating early satiety further.  Lab work ultrasound CT unrevealing for cause.  History of elevated LFTs and ferritin.  Heterozygous for  HFE hemochromatosis gene..  Followed by hematology.  MiraLAX and Protonix prescribed through the office recently.  Bowel function somewhat better on MiraLAX.  Cannot tell much difference on Protonix.  Denies dysphagia.  Early satiety symptoms pretty much unchanged.  Past Medical History:  Diagnosis Date  . Anxiety   . Chest pain   . Depression   . Diabetes mellitus without complication ([place])   . Diverticulitis   . Hypercholesterolemia   . Hypertension   . Lower back pain   . Rheumatoid arthritis ([place])   . Sleep apnea    on CPAP    Past Surgical History:  Procedure Laterality Date  . ABDOMINAL HYSTERECTOMY    . CHOLECYSTECTOMY    . COLONOSCOPY    . ESOPHAGOGASTRODUODENOSCOPY  12/21/2016   [facility]; normal esophagus, small hiatal hernia, nonbleeding erosions in the gastric antrum/B biopsied, normal pylorus, normal duodenum.  Pathology with chronic nonspecific gastritis, H. pylori negative.  . OTHER SURGICAL HISTORY     HYSTERECTOMY 06/27/1997  . 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 Authorizing Provider  clonazePAM (KLONOPIN) 0.25 MG disintegrating tablet Take 0.25 mg by mouth 2 (two) times daily as needed (anxiety).    Yes [provider]  famotidine (PEPCID) 20 MG tablet Take 20 mg by mouth at bedtime.    Yes [provider]  ketoconazole (NIZORAL) 2 % cream Apply 1 application topically 2 (two) times daily as needed (itching.).    Yes [provider]  lansoprazole (PREVACID) 30 MG capsule Take 30 mg  by mouth daily before breakfast.    Yes [provider]  lisinopril (ZESTRIL) 40 MG tablet Take 40 mg by mouth daily.   Yes [provider]  metFORMIN (GLUCOPHAGE) 1000 MG tablet Take 1,000 mg by mouth in the morning and at bedtime.   Yes [provider]  predniSONE (DELTASONE) 10 MG tablet Take 10 mg by mouth daily.    Yes [provider]  sulfaSALAzine (AZULFIDINE) 500 MG tablet Take 500 mg by mouth in the morning, at noon, and at bedtime.    Yes [provider]  ondansetron (ZOFRAN) 4 MG tablet Take 1 tablet (4 mg total) by mouth every 8 (eight) hours as needed for nausea or vomiting. Patient not taking: Reported on 07/31/2019 07/15/19   Erenest Rasher, PA-C    Allergies as of 07/15/2019  . (No Known Allergies)    Family History  Problem Relation Age of Onset  . Hypertension Mother   . Fibromyalgia Mother   . Anxiety disorder Mother   . Parkinson's disease Mother   . Hypertension Father   . Diabetes Father   . Heart disease Father   . Coronary artery disease Father   . Parkinson's disease Maternal Uncle   . Parkinson's disease Maternal Grandmother   . Alzheimer's disease Paternal Grandmother   . Sleep apnea Son   . Colon cancer Neg Hx     Social History   Socioeconomic History  . Marital status: Married    Spouse name: Not  on file  . Number of children: 2  . Years of education: Not on file  . Highest education level: Not on file  Occupational History    Comment: [employer]  Tobacco Use  . Smoking status: Current Some Day Smoker    Types: Cigarettes    Last attempt to quit: 06/28/2003    Years since quitting: 16.2  . Smokeless tobacco: Never Used  . Tobacco comment: does e-ciggs now   Substance and Sexual Activity  . Alcohol use: Yes    Comment: socially; 8-9 beer on the weekend  . Drug use: Not Currently  . Sexual activity: Yes  Other Topics Concern  . Not on file  Social History Narrative  . Not on file    Social Determinants of Health   Financial Resource Strain:   . Difficulty of Paying Living Expenses:   Food Insecurity:   . Worried About Charity fundraiser in the Last Year:   . Arboriculturist in the Last Year:   Transportation Needs:   . Film/video editor (Medical):   Marland Kitchen Lack of Transportation (Non-Medical):   Physical Activity:   . Days of Exercise per Week:   . Minutes of Exercise per Session:   Stress:   . Feeling of Stress :   Social Connections:   . Frequency of Communication with Friends and Family:   . Frequency of Social Gatherings with Friends and Family:   . Attends Religious Services:   . Active Member of Clubs or Organizations:   . Attends Archivist Meetings:   Marland Kitchen Marital Status:   Intimate Partner Violence:   . Fear of Current or Ex-Partner:   . Emotionally Abused:   Marland Kitchen Physically Abused:   . Sexually Abused:     Review of Systems: See HPI, otherwise negative ROS  Physical Exam: BP 138/86   Pulse 72   Temp 98.2 F (36.8 C) (Oral)   Resp (!) 21   SpO2 98%  General:   Alert,  Well-developed, well-nourished, pleasant and cooperative in NAD SNeck:  Supple; no masses or thyromegaly. No significant cervical adenopathy. Lungs:  Clear throughout to auscultation.   No wheezes, crackles, or rhonchi. No acute distress. Heart:  Regular rate and rhythm; no murmurs, clicks, rubs,  or gallops. Abdomen: Non-distended, normal bowel sounds.  Soft and nontender without appreciable mass or hepatosplenomegaly.  Pulses:  Normal pulses noted. Extremities:  Without clubbing or edema.  Impression/Plan: 54 year old lady with early satiety nausea/bloating.  Longstanding diabetes.  Dysphagia.  Here for an EGD to further evaluate.  Suspect underlying gastroparesis no mechanical lesion found. The risks, benefits, limitations, alternatives and imponderables have been reviewed with the patient. Potential for esophageal dilation, biopsy, etc. have also been reviewed.   Questions have been answered. All parties agreeable.     Notice: This dictation was prepared with Dragon dictation along with smaller phrase technology. Any transcriptional errors that result from this process are unintentional and may not be corrected upon review.

## 2019-09-23 NOTE — Op Note (Signed)
[facility] Patient Name: Teresa Mccoy Procedure Date: 09/23/2019 9:44 AM MRN: [id number] Date of Birth: 08-02-1965 Attending MD: Norvel Richards , MD CSN: [id number] Age: 54 Admit Type: Outpatient Procedure:                Upper GI endoscopy Indications:              Abdominal bloating, Early satiety Providers:                Norvel Richards, MD, Jeanann Lewandowsky. Niasha Seller, RN,                            Nelma Rothman, Technician Referring MD:             Sheryle Spray. Lovena Le Medicines:                Propofol per Anesthesia Complications:            No immediate complications. Estimated Blood Loss:     Animal. Procedure:                After obtaining informed consent, the endoscope was                            passed under direct vision. Throughout the                            procedure, the patient's blood pressure, pulse, and                            oxygen saturations were monitored continuously. The                            [id number]) was introduced through the                            mouth, and advanced to the second part of duodenum. Scope In: 10:04:07 AM Scope Out: 10:08:46 AM Total Procedure Duration: 0 hours 4 minutes 39 seconds  Findings:      The examined esophagus was normal.      The entire examined stomach was normal. Discrete moth-eaten disruption       in mucosa about 4 mm in diameter in the second portion of the duodenum       and remainder of D1 and D2 appeared normal. Please see photos. This area       was biopsied Impression:               - Normal esophagus.                           - Normal stomach.                           -Abnormal duodenum of uncertain significance -                            status post biopsy. I suspect diabetic  gastroparesis. Will pursue a solid-phase gastric                            emptying study. Follow-up on pathology. Moderate Sedation:      Moderate (conscious) sedation was  personally administered by an       anesthesia professional. The following parameters were monitored: oxygen       saturation, heart rate, blood pressure, respiratory rate, EKG, adequacy       of pulmonary ventilation, and response to care. Recommendation:            Procedure Code(s):        --- Professional ---                           [phone], Esophagogastroduodenoscopy, flexible,                            transoral; diagnostic, including collection of                            specimen(s) by brushing or washing, when performed                            (separate procedure) Diagnosis Code(s):        --- Professional ---                           R14.0, Abdominal distension (gaseous)                           R68.81, Early satiety CPT copyright 2019 American Medical Association. All rights reserved. The codes documented in this report are preliminary and upon coder review may  be revised to meet current compliance requirements. Cristopher Estimable. Josselin Gaulin, MD Norvel Richards, MD 09/23/2019 10:20:55 AM This report has been signed electronically. Number of Addenda: 0

## 2019-09-23 NOTE — Anesthesia Postprocedure Evaluation (Signed)
Anesthesia Post Note  Patient: [place]  Procedure(s) Performed: ESOPHAGOGASTRODUODENOSCOPY (EGD) WITH PROPOFOL (N/A ) BIOPSY  Patient location during evaluation: PACU Anesthesia Type: General Level of consciousness: awake and alert Pain management: pain level controlled Vital Signs Assessment: post-procedure vital signs reviewed and stable Respiratory status: spontaneous breathing, nonlabored ventilation and respiratory function stable Cardiovascular status: stable Postop Assessment: no apparent nausea or vomiting Anesthetic complications: no     Last Vitals:  Vitals:   09/23/19 0826  BP: 138/86  Pulse: 72  Resp: (!) 21  Temp: 36.8 C  SpO2: 98%    Last Pain:  Vitals:   09/23/19 0958  TempSrc:   PainSc: 0-No pain                 Alliene Klugh Hristova

## 2019-09-25 LAB — SURGICAL PATHOLOGY

## 2019-09-28 ENCOUNTER — Encounter: Payer: Self-pay | Admitting: Internal Medicine

## 2019-09-30 ENCOUNTER — Telehealth: Payer: Self-pay | Admitting: Internal Medicine

## 2019-09-30 DIAGNOSIS — R112 Nausea with vomiting, unspecified: Secondary | ICD-10-CM

## 2019-09-30 NOTE — Telephone Encounter (Signed)
Received a call from Teresa Mccoy with [facility] and no PA is required

## 2019-09-30 NOTE — Telephone Encounter (Signed)
Per d/c note "Schedule a solid-phase gastric emptying study to evaluate how well your stomach is emptying"  GES scheduled for 4/13 at 8:00am, arrival 7:45am, npo midnight, no stomach medications after midnight  LMOVM for pt  [facility] at [phone] and LMOVM for [name] to see if PA is required for GES

## 2019-09-30 NOTE — Telephone Encounter (Signed)
-  Abnormal duodenum of uncertain significance - status post biopsy. I suspect diabetic gastroparesis. Will pursue a solid-phase gastric emptying study. Per RMR notes in procedure report.

## 2019-10-01 NOTE — Telephone Encounter (Signed)
Spoke with patient and she is aware of GES appt details. She voiced understanding and nothing further needed

## 2019-10-01 NOTE — Telephone Encounter (Signed)
Melanie at [facility]-service center called office, said pt needed PA for GES. [place], informed her ME spoke to [place] at [facility] yesterday and was told no PA was needed for GES.

## 2019-10-03 ENCOUNTER — Encounter: Payer: Self-pay | Admitting: *Deleted

## 2019-10-08 ENCOUNTER — Encounter (HOSPITAL_COMMUNITY): Payer: Self-pay

## 2019-10-08 ENCOUNTER — Other Ambulatory Visit: Payer: Self-pay

## 2019-10-08 ENCOUNTER — Encounter (HOSPITAL_COMMUNITY)
Admission: RE | Admit: 2019-10-08 | Discharge: 2019-10-08 | Disposition: A | Discharge status: Home / Self Care | Payer: PRIVATE HEALTH INSURANCE | Source: Ambulatory Visit | Attending: Internal Medicine | Admitting: Internal Medicine

## 2019-10-08 DIAGNOSIS — R112 Nausea with vomiting, unspecified: Secondary | ICD-10-CM | POA: Diagnosis present

## 2019-10-08 MED ORDER — TECHNETIUM TC 99M SULFUR COLLOID
2.0000 | Freq: Once | INTRAVENOUS | Status: AC | PRN
  Administered 2019-10-08: 2 via ORAL

## 2019-10-29 NOTE — Progress Notes (Deleted)
Referring Provider: Marylee Floras, FNP Primary Care Physician:  Marylee Floras, FNP Primary GI Physician: Dr. Gala Romney  No chief complaint on file.   HPI:   Teresa Mccoy is a 54 y.o. female presenting today for follow-up of nausea without vomiting, early satiety, heartburn, LLQ pain, and elevated LFTs.  She was last seen in our office at the time of initial consult on 07/15/2019 for the same.  Prior to office visit, she had abdominal ultrasound 07/01/2019 that was unremarkable.  CT A/P with contrast 07/02/2019 without acute findings, likely fatty liver.  Prior labs with AST 64, ALT 91.  CBC within normal limits.  Amylase and lipase within normal limits.  At the time of her office visit, she reported 72-month history of significant early satiety eating 1 meal a day and getting full off liquids, nausea without vomiting, frequent heartburn/indigestion that have been worsening, and mild weight loss of 4 pounds in the last 4 months.  Denies dysphagia or significant upper abdominal pain.  BMs were at baseline and may fluctuate from loose stools to a couple days without a BM.  Admitted to straining.  Mild LLQ pain x1 day.  Denied BRBPR or melena.  Maternal uncle with Crohn's disease.  Regarding elevated LFTs, she admitted to 8-9 beer on the weekends.  Also with history of rheumatoid arthritis.  No other autoimmune conditions, no regular Tylenol, no IV or intranasal drug use.  No signs or symptoms of advanced liver disease.  Plans included EGD, Protonix 40 mg daily, Zofran 4 mg as needed, GERD diet, try to avoid NSAIDs, decrease alcohol, check labs for hepatitis A, B, and C, and iron panel.  She would continue monitoring LLQ pain and let us know if this worsened.  Started on MiraLAX daily for possible constipation.  Labs completed 07/15/2019: AST/ALT continue to increase at 151/129 respectively.  Negative hepatitis screen.  No immunity to hepatitis A or B.  Ferritin elevated at 963 which could be related  to RA but recommended hemochromatosis labs in [facility] in 4 weeks.  See PCP for hep A and B vaccines.  Instructions for fatty liver provided.  Advised to avoid all OTC supplements. 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  She was referred to hematology for further evaluation.  07/31/2019: LFTs decreased with AST 107 and ALT 106.  EGD 09/23/2019: Normal esophagus, normal stomach abnormal duodenum of uncertain significance (discrete moth-eaten disruption of mucosa about 4 mm in diameter in the second portion of duodenum) s/p biopsy.  Suspected diabetic gastroparesis and ordered GES.  Surgical pathology with duodenal adenoma. No high grade dysplasia or malignancy. If persistent symptoms, consider further imaging of small bowel, otherwise repeat EGD in 1 year.  GES 10/08/2019: Normal study.  Today:  Early satiety/Nausea:   GERD:   Elevated LFTs:   *Had reported colonoscopy 5 years ago at [facility].  Requested records but no colonoscopy records received.   *Repeat HFP. Heeds Hep A/B vaccine. May need CTE.   Past Medical History:  Diagnosis Date  . Anxiety   . Chest pain   . Depression   . Diabetes mellitus without complication ([place])   . Diverticulitis   . Hypercholesterolemia   . Hypertension   . Lower back pain   . Rheumatoid arthritis ([place])   . Sleep apnea    on CPAP    Past Surgical History:  Procedure Laterality Date  . ABDOMINAL HYSTERECTOMY    . BIOPSY  09/23/2019  Procedure: BIOPSY;  Surgeon: Daneil Dolin, MD;  [facility]: [facility];  Service: Endoscopy;;  duodenum  . CHOLECYSTECTOMY    . COLONOSCOPY    . ESOPHAGOGASTRODUODENOSCOPY  12/21/2016   [facility]; normal esophagus, small hiatal hernia, nonbleeding erosions in the gastric antrum/B biopsied, normal pylorus, normal duodenum.  Pathology with chronic nonspecific gastritis, H. pylori negative.  . ESOPHAGOGASTRODUODENOSCOPY (EGD) WITH PROPOFOL N/A  09/23/2019   Procedure: ESOPHAGOGASTRODUODENOSCOPY (EGD) WITH PROPOFOL;  Surgeon: Daneil Dolin, MD;  Location: [facility];  Service: Endoscopy;  Laterality: N/A;  9:45am  . OTHER SURGICAL HISTORY     HYSTERECTOMY 06/27/1997  . 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 clonazePAM (KLONOPIN) 0.25 MG disintegrating tablet Take 0.25 mg by mouth 2 (two) times daily as needed (anxiety).     . famotidine (PEPCID) 20 MG tablet Take 20 mg by mouth at bedtime.     Marland Kitchen ketoconazole (NIZORAL) 2 % cream Apply 1 application topically 2 (two) times daily as needed (itching.).     Marland Kitchen lansoprazole (PREVACID) 30 MG capsule Take 30 mg by mouth daily before breakfast.     . lisinopril (ZESTRIL) 40 MG tablet Take 40 mg by mouth daily.    . metFORMIN (GLUCOPHAGE) 1000 MG tablet Take 1,000 mg by mouth in the morning and at bedtime.    . predniSONE (DELTASONE) 10 MG tablet Take 10 mg by mouth daily.     Marland Kitchen sulfaSALAzine (AZULFIDINE) 500 MG tablet Take 500 mg by mouth in the morning, at noon, and at bedtime.      No current facility-administered medications for this visit.    Allergies as of 10/30/2019  . (No Known Allergies)    Family History  Problem Relation Age of Onset  . Hypertension Mother   . Fibromyalgia Mother   . Anxiety disorder Mother   . Parkinson's disease Mother   . Hypertension Father   . Diabetes Father   . Heart disease Father   . Coronary artery disease Father   . Parkinson's disease Maternal Uncle   . Parkinson's disease Maternal Grandmother   . Alzheimer's disease Paternal Grandmother   . Sleep apnea Son   . Colon cancer Neg Hx     Social History   Socioeconomic History  . Marital status: Married    Spouse name: Not on file  . Number of children: 2  . Years of education: Not on file  . Highest education level: Not on file  Occupational History    Comment: [employer]  Tobacco Use  .  Smoking status: Current Some Day Smoker    Types: Cigarettes    Last attempt to quit: 06/28/2003    Years since quitting: 16.3  . Smokeless tobacco: Never Used  . Tobacco comment: does e-ciggs now   Substance and Sexual Activity  . Alcohol use: Yes    Comment: socially; 8-9 beer on the weekend  . Drug use: Not Currently  . Sexual activity: Yes  Other Topics Concern  . Not on file  Social History Narrative  . Not on file   Social Determinants of Health   Financial Resource Strain:   . Difficulty of Paying Living Expenses:   Food Insecurity:   . Worried About Charity fundraiser in the Last Year:   . Arboriculturist in the Last Year:   Transportation Needs:   . Lack  of Transportation (Medical):   Marland Kitchen Lack of Transportation (Non-Medical):   Physical Activity:   . Days of Exercise per Week:   . Minutes of Exercise per Session:   Stress:   . Feeling of Stress :   Social Connections:   . Frequency of Communication with Friends and Family:   . Frequency of Social Gatherings with Friends and Family:   . Attends Religious Services:   . Active Member of Clubs or Organizations:   . Attends Archivist Meetings:   Marland Kitchen Marital Status:     Review of Systems: Gen: Denies fever, chills, anorexia. Denies fatigue, weakness, weight loss.  CV: Denies chest pain, palpitations, syncope, peripheral edema, and claudication. Resp: Denies dyspnea at rest, cough, wheezing, coughing up blood, and pleurisy. GI: Denies vomiting blood, jaundice, and fecal incontinence.   Denies dysphagia or odynophagia. Derm: Denies rash, itching, dry skin Psych: Denies depression, anxiety, memory loss, confusion. No homicidal or suicidal ideation.  Heme: Denies bruising, bleeding, and enlarged lymph nodes.  Physical Exam: There were no vitals taken for this visit. General:   Alert and oriented. No distress noted. Pleasant and cooperative.  Head:  Normocephalic and atraumatic. Eyes:  Conjuctiva clear without  scleral icterus. Mouth:  Oral mucosa pink and moist. Good dentition. No lesions. Heart:  S1, S2 present without murmurs appreciated. Lungs:  Clear to auscultation bilaterally. No wheezes, rales, or rhonchi. No distress.  Abdomen:  +BS, soft, non-tender and non-distended. No rebound or guarding. No HSM or masses noted. Msk:  Symmetrical without gross deformities. Normal posture. Extremities:  Without edema. Neurologic:  Alert and  oriented x4 Psych:  Alert and cooperative. Normal mood and affect.

## 2019-10-30 ENCOUNTER — Telehealth: Payer: Self-pay | Admitting: Internal Medicine

## 2019-10-30 ENCOUNTER — Encounter: Payer: Self-pay | Admitting: Internal Medicine

## 2019-10-30 ENCOUNTER — Ambulatory Visit: Payer: PRIVATE HEALTH INSURANCE | Admitting: Gastroenterology

## 2019-10-30 NOTE — Telephone Encounter (Signed)
Patient was a no show and letter sent  °

## 2019-10-30 NOTE — Telephone Encounter (Signed)
Noted  

## 2019-11-21 ENCOUNTER — Inpatient Hospital Stay (HOSPITAL_COMMUNITY): Payer: PRIVATE HEALTH INSURANCE | Attending: Hematology

## 2019-11-27 ENCOUNTER — Inpatient Hospital Stay (HOSPITAL_COMMUNITY): Payer: PRIVATE HEALTH INSURANCE | Admitting: Nurse Practitioner

## 2020-08-20 ENCOUNTER — Encounter: Payer: Self-pay | Admitting: Gastroenterology

## 2020-08-20 ENCOUNTER — Ambulatory Visit (INDEPENDENT_AMBULATORY_CARE_PROVIDER_SITE_OTHER): Payer: PRIVATE HEALTH INSURANCE | Admitting: Gastroenterology

## 2020-08-20 ENCOUNTER — Encounter: Payer: Self-pay | Admitting: *Deleted

## 2020-08-20 ENCOUNTER — Other Ambulatory Visit: Payer: Self-pay

## 2020-08-20 VITALS — BP 126/79 | HR 80 | Temp 97.0°F | Ht 65.0 in | Wt 270.2 lb

## 2020-08-20 DIAGNOSIS — D132 Benign neoplasm of duodenum: Secondary | ICD-10-CM

## 2020-08-20 DIAGNOSIS — R109 Unspecified abdominal pain: Secondary | ICD-10-CM | POA: Diagnosis not present

## 2020-08-20 DIAGNOSIS — R1032 Left lower quadrant pain: Secondary | ICD-10-CM

## 2020-08-20 DIAGNOSIS — R7989 Other specified abnormal findings of blood chemistry: Secondary | ICD-10-CM

## 2020-08-20 MED ORDER — CIPROFLOXACIN HCL 500 MG PO TABS: 500.0000 mg | ORAL_TABLET | Freq: Two times a day (BID) | ORAL | 0 refills | Status: AC

## 2020-08-20 MED ORDER — METRONIDAZOLE 500 MG PO TABS: 500.0000 mg | ORAL_TABLET | Freq: Three times a day (TID) | ORAL | 0 refills | Status: AC

## 2020-08-20 NOTE — Progress Notes (Addendum)
Referring Provider: Marylee Floras, FNP Primary Care Physician:  Marylee Floras, FNP  Rheumatologist: [facility] Marella Chimes, PA-C)   Chief Complaint  Patient presents with   Abdominal Pain    Right sided upper/lower, comes/goes   Nausea    No vomiting, comes/goes    HPI:   Teresa Mccoy is a 55 y.o. female presenting today with a history of abdominal pain, bloating, nausea, s/p EGD last year with normal esophagus, normal stomach, abnormal duodenum of uncertain significance s/p biopsy. Path with small duodenal adenoma. Needs surveillance EGD now. Normal GES. Gallbladder absent. 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 Recommended Hematology evaluation, which she completed last year, but she has not followed- up. Ferritin 500-900 last year and sats 26. Negative Hep A, B, C, not immune to Hep A/B.   Labs July 2021 with AST 119, ALT 170, Alk Phos 122. Feb 2022 Alk Phos 166, AST 115, ALT 145. Tbili 0.6. Hgb 14.4, HCt 41.5, Platelets 102. A1c 8. Fibrosis score 4.87?    Chronic right-sided abdominal pain, now with LLQ pain recently in the last few days. History of diverticulitis. Not worsened with movement. Hurt for about 20 minutes yesterday evening then resolved. No fever/chills.  Right-sided abdominal pain come and goes. If sitting, will start hurting but eventually will go away. No pain after eating. Associated nausea intermittently but no vomiting. No GERD. Feels like she is choking when taking pills. Feels like she can feel her food going down her throat. No constipation or diarrhea.   Sees Rheumatologist in [place]. Taken off of sulfasalazine. No herbal supplements. Takes Ibuprofen depending on pain.    Past Medical History:  Diagnosis Date   Anxiety    Chest pain    Depression    Diabetes mellitus without complication ([place])    Diverticulitis    Hypercholesterolemia    Hypertension    Lower back pain     Rheumatoid arthritis ([place])    Sleep apnea    on CPAP    Past Surgical History:  Procedure Laterality Date   ABDOMINAL HYSTERECTOMY     BIOPSY  09/23/2019   Procedure: BIOPSY;  Surgeon: Daneil Dolin, MD;  [facility];  Service: Endoscopy;;  duodenum   CHOLECYSTECTOMY     COLONOSCOPY     ESOPHAGOGASTRODUODENOSCOPY  12/21/2016   [facility]; normal esophagus, small hiatal hernia, nonbleeding erosions in the gastric antrum/B biopsied, normal pylorus, normal duodenum.  Pathology with chronic nonspecific gastritis, H. pylori negative.   ESOPHAGOGASTRODUODENOSCOPY (EGD) WITH PROPOFOL N/A 09/23/2019   normal esophagus, normal stomach, abnormal duodenum of uncertain significance s/p biopsy. Path with small duodenal adenoma. Needs surveillance EGD now.    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      FARXIGA 5 MG TABS tablet Take 5 mg by mouth daily.     predniSONE (DELTASONE) 10 MG tablet Take 10 mg by mouth daily.      ondansetron (ZOFRAN) 4 MG tablet Take 1 tablet (4 mg total) by mouth every 8 (eight) hours as needed for nausea or vomiting. 30 tablet 1   No current facility-administered medications for this visit.    Allergies as of 08/20/2020   (  No Known Allergies)    Family History  Problem Relation Age of Onset   Hypertension Mother    Fibromyalgia Mother    Anxiety disorder Mother    Parkinson's disease Mother    Hypertension Father    Diabetes Father    Heart disease Father    Coronary artery disease Father    Parkinson's disease Maternal Uncle    Parkinson's disease Maternal Grandmother    Alzheimer's disease Paternal Grandmother    Sleep apnea Son    Colon cancer Neg Hx      Social History   Socioeconomic History   Marital status: Married    Spouse name: Not on file   Number of children: 2   Years of education: Not on file   Highest education level: Not on file  Occupational History    Comment: [employer]  Tobacco Use   Smoking status: Current Some Day Smoker    Types: Cigarettes    Last attempt to quit: 06/28/2003    Years since quitting: 17.1   Smokeless tobacco: Never Used   Tobacco comment: vapes  Vaping Use   Vaping Use: Every day   Substances: Nicotine, CBD, Flavoring  Substance and Sexual Activity   Alcohol use: Yes    Comment: socially; Friday/Saturday beer 6-7 beers    Drug use: Not Currently   Sexual activity: Yes  Other Topics Concern   Not on file  Social History Narrative   Not on file   Social Determinants of Health   Financial Resource Strain: Not on file  Food Insecurity: Not on file  Transportation Needs: Not on file  Physical Activity: Not on file  Stress: Not on file  Social Connections: Not on file    Review of Systems: Gen: Denies fever, chills, anorexia. Denies fatigue, weakness, weight loss.  CV: Denies chest pain, palpitations, syncope, peripheral edema, and claudication. Resp: Denies dyspnea at rest, cough, wheezing, coughing up blood, and pleurisy. GI: see HPI Derm: Denies rash, itching, dry skin Psych: Denies depression, anxiety, memory loss, confusion. No homicidal or suicidal ideation.  Heme: Denies bruising, bleeding, and enlarged lymph nodes.  Physical Exam: BP 126/79    Pulse 80    Temp (!) 97 F (36.1 C)    Ht '5\' 5"'  (1.651 m)    Wt 270 lb 3.2 oz (122.6 kg)    BMI 44.96 kg/m  General:   Alert and oriented. No distress noted. Pleasant and cooperative.  Head:  Normocephalic and atraumatic. Eyes:  Conjuctiva clear without scleral icterus. Mouth:  Mask in place Lungs: clear bilaterally Cardiac: S1 S2 present without murmurs Abdomen:  +BS, soft, TTP RUQand non-distended. No  rebound or guarding. No HSM or masses noted. Msk:  Symmetrical without gross deformities. Normal posture. Extremities:  Without edema. Neurologic:  Alert and  oriented x4 Psych:  Alert and cooperative. Normal mood and affect.  RUQ [place] July 2021: normal CBD at 4-5 mm, fatty liver. Portal vein patent.   CT Abdomen and pelvis with contrast dated 07/02/2019 for abdominal distention. Impression: Diverticulosis without inflammatory changes.  No acute intra-abdominal or pelvic processes. Reviewed findings.  Liver with diffuse low attenuating architecture without focal abnormalities.  Pancreas, spleen, kidneys, adrenal glands normal. No ascites noted.  Outside labs : Labs July 2021 with AST 119, ALT 170, Alk Phos 122. Feb 2022 Alk Phos 166, AST 115, ALT 145. Tbili 0.6. Hgb 14.4, HCt 41.5, Platelets 102. A1c 8. Fibrosis score 4.87?     ASSESSMENT: Teresa Eckstein  Mccoy is a 55 y.o. female presenting today with history of abdominal pain, nausea, bloating, duodenal adenoma last year on EGD and need for surveillance, elevated transaminases in setting of fatty liver and RUQ pain. Reporting RUQ pain, LLQ abdominal pain, vague dysphagia.   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  Dysphagia: vague sensation of feeling food as it goes down, pill dysphagia. Possible dilation at time of EGD.   RUQ pain: gallbladder absent. Elevated transaminases persistently. Update US abdomen complete. Thorough serologies today due to elevated transaminases. 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 Recommended Hematology evaluation, which she completed last year, but she has not followed- up. Ferritin 500-900 last year and sats 26. Negative Hep A, B, C, not immune to Hep A/B. Providing Hep A/B vaccination.   LLQ pain: vague today. Reports history of diverticulitis. As it is the weekend, I have provided Cipro and Flagyl only to start if she has worsening pain, and then would need to  call us Monday morning with update so we may arrange imaging. She is not to take if this resolves.    PLAN:  Proceed with upper endoscopy/dilation by Dr. Gala Romney in near future using Propofol: the risks, benefits, and alternatives have been discussed with the patient in detail. The patient states understanding and desires to proceed.   Extensive serologies due to elevated transaminases  US abdomen complete  May need liver biopsy  Hep A/B vaccination prescription provided  Avoid all alcohol  2 month follow-up   Annitta Needs, PhD, ANP-BC [facility]

## 2020-08-20 NOTE — Patient Instructions (Addendum)
Please complete blood work.  We are arranging an ultrasound of the liver and spleen in near future.   We are also arranging an upper endoscopy by Dr. Gala Romney in near future and can dilate if needed.   I have sent in Cipro and Flagyl to the pharmacy. Please only pick up if you have persistent left lower abdominal pain, fever. Please call us if you pick this up.   We recommend Hepatitis A and B vaccinations for immunization purposes. We have provided a prescription you can take to your primary care.   We may ultimately need to pursue a liver biopsy, but we will review labs first.  It's very important to avoid all alcohol. You can take tylenol up to 2 grams in a 24 hour period but limit this as well. I would avoid Ibuprofen and similar medications.  It's also important to eat healthy, exercise, and work on healthy weight loss. Diabetes management is important as well to help protect your liver.   We will see you in 2 months!  It was a pleasure to see you today. I want to create trusting relationships with patients to provide genuine, compassionate, and quality care. I value your feedback. If you receive a survey regarding your visit,  I greatly appreciate you taking time to fill this out.   Annitta Needs, PhD, ANP-BC [facility]    Fatty Liver Disease  The liver converts food into energy, removes toxic material from the blood, makes important proteins, and absorbs necessary vitamins from food. Fatty liver disease occurs when too much fat has built up in your liver cells. Fatty liver disease is also called hepatic steatosis. In many cases, fatty liver disease does not cause symptoms or problems. It is often diagnosed when tests are being done for other reasons. However, over time, fatty liver can cause inflammation that may lead to more serious liver problems, such as scarring of the liver (cirrhosis) and liver failure. Fatty liver is associated with insulin resistance,  increased body fat, high blood pressure (hypertension), and high cholesterol. These are features of metabolic syndrome and increase your risk for stroke, diabetes, and heart disease. What are the causes? This condition may be caused by components of metabolic syndrome:  Obesity.  Insulin resistance.  High cholesterol. Other causes:  Alcohol abuse.  Poor nutrition.  Cushing syndrome.  Pregnancy.  Certain drugs.  Poisons.  Some viral infections. What increases the risk? You are more likely to develop this condition if you:  Abuse alcohol.  Are overweight.  Have diabetes.  Have hepatitis.  Have a high triglyceride level.  Are pregnant. What are the signs or symptoms? Fatty liver disease often does not cause symptoms. If symptoms do develop, they can include:  Fatigue and weakness.  Weight loss.  Confusion.  Nausea, vomiting, or abdominal pain.  Yellowing of your skin and the white parts of your eyes (jaundice).  Itchy skin. How is this diagnosed? This condition may be diagnosed by:  A physical exam and your medical history.  Blood tests.  Imaging tests, such as an ultrasound, CT scan, or MRI.  A liver biopsy. A small sample of liver tissue is removed using a needle. The sample is then looked at under a microscope. How is this treated? Fatty liver disease is often caused by other health conditions. Treatment for fatty liver may involve medicines and lifestyle changes to manage conditions such as:  Alcoholism.  High cholesterol.  Diabetes.  Being overweight or obese. Follow these  instructions at home:  Do not drink alcohol. If you have trouble quitting, ask your health care provider how to safely quit with the help of medicine or a supervised program. This is important to keep your condition from getting worse.  Eat a healthy diet as told by your health care provider. Ask your health care provider about working with a dietitian to develop an  eating plan.  Exercise regularly. This can help you lose weight and control your cholesterol and diabetes. Talk to your health care provider about an exercise plan and which activities are best for you.  Take over-the-counter and prescription medicines only as told by your health care provider.  Keep all follow-up visits. This is important.   Contact a health care provider if:  You have trouble controlling your: ? Blood sugar. This is especially important if you have diabetes. ? Cholesterol. ? Drinking of alcohol. Get help right away if:  You have abdominal pain.  You have jaundice.  You have nausea and are vomiting.  You vomit blood or material that looks like coffee grounds.  You have stools that are black, tar-like, or bloody. Summary  Fatty liver disease develops when too much fat builds up in the cells of your liver.  Fatty liver disease often causes no symptoms or problems. However, over time, fatty liver can cause inflammation that may lead to more serious liver problems, such as scarring of the liver (cirrhosis).  You are more likely to develop this condition if you abuse alcohol, are pregnant, are overweight, have diabetes, have hepatitis, or have high triglyceride or cholesterol levels.  Contact your health care provider if you have trouble controlling your blood sugar, cholesterol, or drinking of alcohol. This information is not intended to replace advice given to you by your health care provider. Make sure you discuss any questions you have with your health care provider. Document Revised: 03/26/2020 Document Reviewed: 03/26/2020 Elsevier Patient Education  2021 [place].  Nonalcoholic Fatty Liver Disease Diet, Adult Nonalcoholic fatty liver disease is a condition that causes fat to build up in and around the liver. The disease makes it harder for the liver to work the way that it should. Following a healthy diet can help to keep nonalcoholic fatty liver disease  under control. It can also help to prevent or improve conditions that are associated with the disease, such as heart disease, diabetes, high blood pressure, and abnormal cholesterol levels. Along with regular exercise, this diet:  Promotes weight loss.  Helps to control blood sugar levels.  Helps to improve the way that the body uses insulin. What are tips for following this plan? Reading food labels Always check food labels for:  The amount of saturated fat in a food. You should limit your intake of saturated fat. Saturated fat is found in foods that come from animals, including meat and dairy products such as butter, cheese, and whole milk.  The amount of fiber in a food. You should choose high-fiber foods such as fruits, vegetables, and whole grains. Try to get 25-30 grams (g) of fiber a day.   Cooking  When cooking, use heart-healthy oils that are high in monounsaturated fats. These include olive oil, canola oil, and avocado oil.  Limit frying or deep-frying foods. Cook foods using healthy methods such as baking, boiling, steaming, and grilling instead. Meal planning  You may want to keep track of how many calories you take in. Eating the right amount of calories will help you achieve a  healthy weight. Meeting with a registered dietitian can help you get started.  Limit how often you eat takeout and fast food. These foods are usually very high in fat, salt, and sugar.  Use the glycemic index (GI) to plan your meals. The index tells you how quickly a food will raise your blood sugar. Choose low-GI foods (GI less than 55). These foods take a longer time to raise blood sugar. A registered dietitian can help you identify foods lower on the GI scale. Lifestyle  You may want to follow a Mediterranean diet. This diet includes a lot of vegetables, lean meats or fish, whole grains, fruits, and healthy oils and fats. What foods can I eat? Fruits Bananas. Apples. Oranges. Grapes. Papaya.  Mango. Pomegranate. Kiwi. Grapefruit. Cherries. Vegetables Lettuce. Spinach. Peas. Beets. Cauliflower. Cabbage. Broccoli. Carrots. Tomatoes. Squash. Eggplant. Herbs. Peppers. Onions. Cucumbers. Brussels sprouts. Yams and sweet potatoes. Beans. Lentils. Grains Whole wheat or whole-grain foods, including breads, crackers, cereals, and pasta. Stone-ground whole wheat. Unsweetened oatmeal. Bulgur. Barley. Quinoa. Brown or wild rice. Corn or whole wheat flour tortillas. Meats and other proteins Lean meats. Poultry. Tofu. Seafood and shellfish. Dairy Low-fat or fat-free dairy products, such as yogurt, cottage cheese, or cheese. Beverages Water. Sugar-free drinks. Tea. Coffee. Low-fat or skim milk. Milk alternatives, such as soy or almond milk. Real fruit juice. Fats and oils Avocado. Canola or olive oil. Nuts and nut butters. Seeds. Seasonings and condiments Mustard. Relish. Low-fat, low-sugar ketchup and barbecue sauce. Low-fat or fat-free mayonnaise. Sweets and desserts Sugar-free sweets. The items listed above may not be a complete list of foods and beverages you can eat. Contact a dietitian for more information.   What foods should I limit or avoid? Meats and other proteins Limit red meat to 1-2 times a week. Dairy [employer]. Fats and oils Palm oil and coconut oil. Fried foods. Other foods Processed foods. Foods that contain a lot of salt or sodium. Sweets and desserts Sweets that contain sugar. Beverages Sweetened drinks, such as sweet tea, milkshakes, iced sweet drinks, and sodas. Alcohol. The items listed above may not be a complete list of foods and beverages you should avoid. Contact a dietitian for more information. Where to find more information The [employer] of Diabetes and Digestive and Kidney Diseases: [employer] Summary  Nonalcoholic fatty liver disease is a condition that causes fat to build up in and around the liver.  Following a healthy diet can  help to keep nonalcoholic fatty liver disease under control. Your diet should be rich in fruits, vegetables, whole grains, and lean proteins.  Limit your intake of saturated fat. Saturated fat is found in foods that come from animals, including meat and dairy products such as butter, cheese, and whole milk.  This diet promotes weight loss, helps to control blood sugar levels, and helps to improve the way that the body uses insulin. This information is not intended to replace advice given to you by your health care provider. Make sure you discuss any questions you have with your health care provider. Document Revised: 10/05/2018 Document Reviewed: 07/05/2018 Elsevier Patient Education  [place].

## 2020-08-24 ENCOUNTER — Telehealth: Payer: Self-pay | Admitting: Internal Medicine

## 2020-08-24 NOTE — Telephone Encounter (Signed)
Patient called to say that her symptoms are getting worse and wants to know if there is anything else that can be done.

## 2020-08-25 ENCOUNTER — Telehealth: Payer: Self-pay | Admitting: *Deleted

## 2020-08-25 NOTE — Telephone Encounter (Signed)
PA approved via [employer]gram. Auth# [id number] DOS 09/28/2020

## 2020-08-25 NOTE — Telephone Encounter (Signed)
Lmom, waiting on a return call.  

## 2020-08-26 ENCOUNTER — Other Ambulatory Visit: Payer: Self-pay

## 2020-08-26 ENCOUNTER — Ambulatory Visit (HOSPITAL_COMMUNITY): Payer: PRIVATE HEALTH INSURANCE

## 2020-08-26 DIAGNOSIS — Z79899 Other long term (current) drug therapy: Secondary | ICD-10-CM

## 2020-08-26 DIAGNOSIS — R1032 Left lower quadrant pain: Secondary | ICD-10-CM

## 2020-08-26 MED ORDER — ONDANSETRON HCL 4 MG PO TABS: 4.0000 mg | ORAL_TABLET | Freq: Three times a day (TID) | ORAL | 1 refills | Status: AC | PRN

## 2020-08-26 NOTE — Telephone Encounter (Signed)
Pt feels abd is swollen, pain in rt side of abd (pain level goes to a 7 1/2) and goes into pts rt side of chest, nausea has worsened with trying to eat something. Pt doesn't have any nausea medications that she is taking. Pt is having soft formed BM once daily. Pt is scheduled for imaging and isn't sure if there is anything she should take prior to her imaging.

## 2020-08-26 NOTE — Telephone Encounter (Signed)
[facility] clinical pool, can we get US abdomen moved up to today? Currently scheduled for 3/4.   Recommend HFP, lipase, CBC today. Zofran 4 mg every 8 hours as needed for nausea sent to pharmacy. If worsening pain, fever/chills, jaundice, go to ED.

## 2020-08-26 NOTE — Addendum Note (Signed)
Addended by: Annitta Needs on: 08/26/2020 10:16 AM   Modules accepted: Orders

## 2020-08-26 NOTE — Telephone Encounter (Signed)
Patient called and will have US done tomorrow

## 2020-08-26 NOTE — Telephone Encounter (Signed)
Routing to Anna Boone, NP.  °

## 2020-08-26 NOTE — Telephone Encounter (Signed)
Called [facility] and they stated patient needs to head over now as she will be worked in.  Called pt, no answer. Left detailed VM that she needs to head over to [facility] now as she is being worked in and if she can't make it then she needs to call [phone].

## 2020-08-26 NOTE — Progress Notes (Signed)
Cc'ed to pcp °

## 2020-08-26 NOTE — Telephone Encounter (Signed)
Spoke with pt. Lab orders placed for [facility]. Pt will pickup Zofran and complete labs.

## 2020-08-27 ENCOUNTER — Other Ambulatory Visit: Payer: Self-pay

## 2020-08-27 ENCOUNTER — Ambulatory Visit (HOSPITAL_COMMUNITY)
Admission: RE | Admit: 2020-08-27 | Discharge: 2020-08-27 | Disposition: A | Discharge status: Home / Self Care | Payer: PRIVATE HEALTH INSURANCE | Source: Ambulatory Visit | Attending: Gastroenterology | Admitting: Gastroenterology

## 2020-08-27 DIAGNOSIS — R7989 Other specified abnormal findings of blood chemistry: Secondary | ICD-10-CM

## 2020-08-27 DIAGNOSIS — R109 Unspecified abdominal pain: Secondary | ICD-10-CM | POA: Diagnosis present

## 2020-08-27 LAB — HEPATIC FUNCTION PANEL
ALT: 111 IU/L — ABNORMAL HIGH (ref 0–32)
AST: 107 IU/L — ABNORMAL HIGH (ref 0–40)
Albumin: 4.2 g/dL (ref 3.8–4.9)
Alkaline Phosphatase: 143 IU/L — ABNORMAL HIGH (ref 44–121)
Bilirubin Total: 0.4 mg/dL (ref 0.0–1.2)
Bilirubin, Direct: 0.16 mg/dL (ref 0.00–0.40)
Total Protein: 7.3 g/dL (ref 6.0–8.5)

## 2020-08-27 LAB — CBC WITH DIFFERENTIAL/PLATELET
Basophils Absolute: 0.1 10*3/uL (ref 0.0–0.2)
Basos: 1 %
EOS (ABSOLUTE): 0.2 10*3/uL (ref 0.0–0.4)
Eos: 3 %
Hematocrit: 41.6 % (ref 34.0–46.6)
Hemoglobin: 13.9 g/dL (ref 11.1–15.9)
Immature Grans (Abs): 0.1 10*3/uL (ref 0.0–0.1)
Immature Granulocytes: 1 %
Lymphocytes Absolute: 2 10*3/uL (ref 0.7–3.1)
Lymphs: 35 %
MCH: 31 pg (ref 26.6–33.0)
MCHC: 33.4 g/dL (ref 31.5–35.7)
MCV: 93 fL (ref 79–97)
Monocytes Absolute: 0.4 10*3/uL (ref 0.1–0.9)
Monocytes: 8 %
Neutrophils Absolute: 3 10*3/uL (ref 1.4–7.0)
Neutrophils: 52 %
Platelets: 142 10*3/uL — ABNORMAL LOW (ref 150–450)
RBC: 4.49 x10E6/uL (ref 3.77–5.28)
RDW: 13.1 % (ref 11.7–15.4)
WBC: 5.8 10*3/uL (ref 3.4–10.8)

## 2020-08-27 LAB — LIPASE: Lipase: 33 U/L (ref 14–72)

## 2020-08-28 ENCOUNTER — Other Ambulatory Visit: Payer: Self-pay | Admitting: Gastroenterology

## 2020-08-28 ENCOUNTER — Ambulatory Visit (HOSPITAL_COMMUNITY): Payer: PRIVATE HEALTH INSURANCE

## 2020-08-28 LAB — ANTI-SMOOTH MUSCLE ANTIBODY, IGG: Actin (Smooth Muscle) Antibody (IGG): <20 U (ref <20)

## 2020-08-28 LAB — ALPHA-1 ANTITRYPSIN PHENOTYPE: A-1 Antitrypsin, Ser: 180 mg/dL (ref 83–199)

## 2020-08-28 LAB — ANTI-NUCLEAR AB-TITER (ANA TITER): ANA Titer 1: 1:80 {titer} — ABNORMAL HIGH

## 2020-08-28 LAB — IRON,TIBC AND FERRITIN PANEL
%SAT: 53 % (calc) — ABNORMAL HIGH (ref 16–45)
Ferritin: 711 ng/mL — ABNORMAL HIGH (ref 16–232)
Iron: 173 ug/dL — ABNORMAL HIGH (ref 45–160)
TIBC: 324 mcg/dL (calc) (ref 250–450)

## 2020-08-28 LAB — PROTIME-INR
INR: 1
Prothrombin Time: 10.3 s (ref 9.0–11.5)

## 2020-08-28 LAB — CERULOPLASMIN: Ceruloplasmin: 34 mg/dL (ref 18–53)

## 2020-08-28 LAB — IGG, IGA, IGM
IgG (Immunoglobin G), Serum: 1243 mg/dL (ref 600–1640)
IgM, Serum: 162 mg/dL (ref 50–300)
Immunoglobulin A: 422 mg/dL — ABNORMAL HIGH (ref 47–310)

## 2020-08-28 LAB — ANA: Anti Nuclear Antibody (ANA): POSITIVE — AB

## 2020-08-28 LAB — MITOCHONDRIAL ANTIBODIES: Mitochondrial M2 Ab, IgG: <=20 U

## 2020-08-28 MED ORDER — PANTOPRAZOLE SODIUM 40 MG PO TBEC: 40.0000 mg | DELAYED_RELEASE_TABLET | Freq: Two times a day (BID) | ORAL | 3 refills | Status: AC

## 2020-08-31 ENCOUNTER — Telehealth: Payer: Self-pay | Admitting: Internal Medicine

## 2020-08-31 NOTE — Telephone Encounter (Signed)
Spoke with pt. Pt had labs at [place] on Friday. I don't see the results in our system. Calling [facility] to see if and when results will be resulted.   Spoke with [place] lab. Lab test results are pending.  Pt is aware that we will contact her when they results.

## 2020-08-31 NOTE — Telephone Encounter (Signed)
Patient called to see if her labs from Friday were ready

## 2020-09-07 LAB — HEMOCHROMATOSIS DNA-PCR(C282Y,H63D)

## 2020-09-10 ENCOUNTER — Other Ambulatory Visit: Payer: Self-pay

## 2020-09-10 DIAGNOSIS — R7989 Other specified abnormal findings of blood chemistry: Secondary | ICD-10-CM

## 2020-09-14 ENCOUNTER — Telehealth: Payer: Self-pay | Admitting: Gastroenterology

## 2020-09-14 ENCOUNTER — Inpatient Hospital Stay (HOSPITAL_COMMUNITY): Payer: PRIVATE HEALTH INSURANCE | Attending: Physician Assistant | Admitting: Physician Assistant

## 2020-09-14 ENCOUNTER — Other Ambulatory Visit: Payer: Self-pay

## 2020-09-14 VITALS — BP 150/87 | HR 84 | Temp 97.2°F | Resp 20 | Wt 269.2 lb

## 2020-09-14 DIAGNOSIS — R7989 Other specified abnormal findings of blood chemistry: Secondary | ICD-10-CM | POA: Diagnosis not present

## 2020-09-14 DIAGNOSIS — F17210 Nicotine dependence, cigarettes, uncomplicated: Secondary | ICD-10-CM | POA: Insufficient documentation

## 2020-09-14 DIAGNOSIS — R5383 Other fatigue: Secondary | ICD-10-CM | POA: Diagnosis not present

## 2020-09-14 DIAGNOSIS — R6881 Early satiety: Secondary | ICD-10-CM | POA: Insufficient documentation

## 2020-09-14 NOTE — Telephone Encounter (Signed)
    [facility] clinical pool:  Please arrange MRI/MRCP for patient. History of elevated LFTs, RUQ pain, elevated ferritin. We need the MRI/MRCP to better evaluate liver/biliary tree and to also assess liver iron concentration, so it would need to be done with and without contrast. Hematology will need this MRI as well due to elevated ferritin. I've spoken with Hematology. I've placed the order. Thanks!  Can we have this done in next week or so?

## 2020-09-14 NOTE — Progress Notes (Signed)
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 female returns for routine follow-up of her elevated ferritin and heterozygous carrier state for hereditary hemochromatosis, which was diagnosed in February 2021.  Patient is heterozygous for H63D hemochromatosis.  Her elevated ferritin was initially discovered during work-up of elevated liver enzymes.  Hepatitis B and C serology were negative, CT of the abdomen/pelvis on 07/02/2019 showed fatty liver, spleen size was normal. She was last seen on 08/22/2019 by Dr. Delton Coombes.  Continues to have elevated LFTs, of undetermined etiology, undergoing work-up with gastroenterology.  She has had progressive worsening of right upper quadrant pain for the last 6 months, described as a dull aching pain that radiates to her right upper chest and right shoulder.  She also continues to see GI due to esophageal dysphagia and trouble swallowing, she is scheduled for an EGD in April.    Patient reports extreme fatigue and lethargy.  Reports that her energy today is 0, but that on an average day it is at about 25%.  She reports 50% appetite.  Complains of early satiety.  She has lost about 6 pounds in the past month, which she attributes to poor appetite and early satiety.  She reports normal dietary iron intake, although she does cooking cast iron pans.  She also admits to alcohol consumption, drinking about 5 beers every Saturday.  She does not take iron pills.  She has not received blood transfusions.   REVIEW OF SYSTEMS:  Review of Systems  Constitutional: Positive for appetite change, fatigue and unexpected weight change (Down 6  pounds in the past month). Negative for chills, diaphoresis and fever.  HENT:   Negative for lump/mass and nosebleeds.   Eyes: Negative for eye problems.  Respiratory: Negative for cough, hemoptysis and shortness of breath.   Cardiovascular: Negative for chest pain, leg swelling and palpitations.  Gastrointestinal: Positive for abdominal pain and nausea. Negative for blood in stool, constipation, diarrhea and vomiting.  Genitourinary: Negative for hematuria.   Musculoskeletal: Positive for gait problem.  Skin: Negative.   Neurological: Positive for gait problem. Negative for dizziness, headaches and light-headedness.  Hematological: Does not bruise/bleed easily.      PAST MEDICAL/SURGICAL HISTORY:  Past Medical History:  Diagnosis Date  . Anxiety   . Chest pain   . Depression   . Diabetes mellitus without complication ([place])   . Diverticulitis   . Hypercholesterolemia   . Hypertension   . Lower back pain   . Rheumatoid arthritis ([place])   . Sleep apnea    on CPAP   Past Surgical History:  Procedure Laterality Date  . ABDOMINAL HYSTERECTOMY    . BIOPSY  09/23/2019   Procedure: BIOPSY;  Surgeon: Daneil Dolin, MD;  Location: [facility];  Service: Endoscopy;;  duodenum  . CHOLECYSTECTOMY    . COLONOSCOPY    . ESOPHAGOGASTRODUODENOSCOPY  12/21/2016   [facility]; normal esophagus, small hiatal hernia, nonbleeding erosions in the gastric antrum/B biopsied, normal pylorus, normal duodenum.  Pathology with chronic nonspecific gastritis, H. pylori negative.  . ESOPHAGOGASTRODUODENOSCOPY (EGD) WITH PROPOFOL N/A 09/23/2019   normal esophagus, normal stomach, abnormal duodenum of uncertain significance s/p biopsy. Path with small duodenal adenoma.  Needs surveillance EGD now.   . OTHER SURGICAL HISTORY     HYSTERECTOMY 06/27/1997  . 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 Marital status:  Married    Spouse name: Not on file  . Number of children: 2  . Years of education: Not on file  . Highest education level: Not on file  Occupational History    Comment: [employer]  Tobacco Use  . Smoking status: Current Some Day Smoker    Types: Cigarettes    Last attempt to quit: 06/28/2003    Years since quitting: 17.2  . Smokeless tobacco: Never Used  . Tobacco comment: vapes  Vaping Use  . Vaping Use: Every day  . Substances: Nicotine, CBD, Flavoring  Substance and Sexual Activity  . Alcohol use: Yes    Comment: socially; Friday/Saturday beer 6-7 beers   . Drug use: Not Currently  . Sexual activity: Yes  Other Topics Concern  . Not on file  Social History Narrative  . Not on file   Social Determinants of Health   Financial Resource Strain: Not on file  Food Insecurity: Not on file  Transportation Needs: Not on file  Physical Activity: Not on file  Stress: Not on file  Social Connections: Not on file  Intimate Partner Violence: Not on file    FAMILY HISTORY:  Family History  Problem Relation Age of Onset  . Hypertension Mother   . Fibromyalgia Mother   . Anxiety disorder Mother   . Parkinson's disease Mother   . Hypertension Father   . Diabetes Father   . Heart disease Father   . Coronary artery disease Father   . Parkinson's disease Maternal Uncle   . Parkinson's disease Maternal Grandmother   . Alzheimer's disease Paternal Grandmother   . Sleep apnea Son   . Colon cancer Neg Hx     CURRENT MEDICATIONS:  Outpatient Encounter Medications as of 09/14/2020  Medication Sig Note  . FARXIGA 5 MG TABS tablet Take 5 mg by mouth daily.   Marland Kitchen ibuprofen (ADVIL) 200 MG tablet Take 400 mg by mouth every 8 (eight) hours as needed for moderate pain.   Marland Kitchen ondansetron (ZOFRAN) 4 MG tablet Take 1 tablet (4 mg total) by mouth every 8 (eight) hours as needed for nausea or vomiting. 09/11/2020: Pt has but has not taken   . pantoprazole (PROTONIX) 40 MG tablet Take 1  tablet (40 mg total) by mouth 2 (two) times daily before a meal. (Patient taking differently: Take 40 mg by mouth daily as needed (acid reflux).)   . predniSONE (DELTASONE) 10 MG tablet Take 10 mg by mouth daily.     No facility-administered encounter medications on file as of 09/14/2020.    ALLERGIES:  No Known Allergies   PHYSICAL EXAM:  ECOG PERFORMANCE STATUS: 2 - Symptomatic, <50% confined to bed  There were no vitals filed for this visit. There were no vitals filed for this visit. Physical Exam Constitutional:      Appearance: Normal appearance. She is obese.  HENT:     Head: Normocephalic and atraumatic.     Mouth/Throat:     Mouth: Mucous membranes are moist.  Eyes:     Extraocular Movements: Extraocular movements intact.     Pupils: Pupils are equal, round, and reactive to light.  Cardiovascular:     Rate and Rhythm: Normal rate and regular  rhythm.     Pulses: Normal pulses.     Heart sounds: Normal heart sounds.  Pulmonary:     Effort: Pulmonary effort is normal.     Breath sounds: Normal breath sounds.  Abdominal:     General: Bowel sounds are normal.     Palpations: Abdomen is soft.     Tenderness: There is abdominal tenderness in the right upper quadrant.  Musculoskeletal:        General: No swelling.     Right lower leg: No edema.     Left lower leg: No edema.  Lymphadenopathy:     Cervical: No cervical adenopathy.  Skin:    General: Skin is warm and dry.  Neurological:     General: No focal deficit present.     Mental Status: She is alert and oriented to person, place, and time.  Psychiatric:        Mood and Affect: Mood normal.        Behavior: Behavior normal.     LABORATORY DATA:  I have reviewed the labs as listed.  CBC    Component Value Date/Time   WBC 5.8 08/26/2020 1148   WBC 9.6 07/31/2019 1231   RBC 4.49 08/26/2020 1148   RBC 4.89 07/31/2019 1231   HGB 13.9 08/26/2020 1148   HCT 41.6 08/26/2020 1148   PLT 142 (L) 08/26/2020 1148    MCV 93 08/26/2020 1148   MCH 31.0 08/26/2020 1148   MCH 31.1 07/31/2019 1231   MCHC 33.4 08/26/2020 1148   MCHC 32.7 07/31/2019 1231   RDW 13.1 08/26/2020 1148   LYMPHSABS 2.0 08/26/2020 1148   MONOABS 0.6 07/31/2019 1231   EOSABS 0.2 08/26/2020 1148   BASOSABS 0.1 08/26/2020 1148   CMP Latest Ref Rng & Units 08/26/2020 07/31/2019 07/31/2019  Glucose 70 - 99 mg/dL - 132(H) -  BUN 6 - 20 mg/dL - 12 -  Creatinine 0.44 - 1.00 mg/dL - 0.67 -  Sodium 135 - 145 mmol/L - 135 -  Potassium 3.5 - 5.1 mmol/L - 4.0 -  Chloride 98 - 111 mmol/L - 98 -  CO2 22 - 32 mmol/L - 28 -  Calcium 8.7 - 10.2 mg/dL - 9.4 10.0  Total Protein 6.0 - 8.5 g/dL 7.3 7.5 -  Total Bilirubin 0.0 - 1.2 mg/dL 0.4 0.7 -  Alkaline Phos 44 - 121 IU/L 143(H) 91 -  AST 0 - 40 IU/L 107(H) 107(H) -  ALT 0 - 32 IU/L 111(H) 106(H) -    DIAGNOSTIC IMAGING:  I have independently reviewed the relevant imaging and discussed with the patient.  ASSESSMENT: 1.  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 -She was found to have elevated liver enzymes and a ferritin level of 963, percent saturation 26 on 07/15/2019. -Hepatitis B and C serology was negative. 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  C282Y was negative.  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 -CT scan of the abdomen and pelvis on 07/02/2019 showed decreased attenuation in the liver consistent with fatty liver.  Spleen size was normal. -Abdominal ultrasound on 08/27/2020 shows increased echogenicity of liver consistent with fatty infiltration of the liver, no splenomegaly. -Moderately increased ferritin level may be seen with heterozygous carriers of hemochromatosis variants -Significant iron overload is extremely rare in heterozygous carriers, but may be precipitated by concomitant alcoholic or nonalcoholic liver disease -Elevated ferritin could be from combination of fatty liver disease and inflammation  from  rheumatoid arthritis, as well as some contribution from H63D heterozygosity for hemochromatosis  2.  Social/family history --No family history of hemochromatosis.   -Maternal uncle had cirrhosis from drinking alcohol.   -Family history positive for liver cancer and lung cancer.   -She drinks beer over the weekend.     PLAN:  1.  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 -Moderately increased ferritin level may be seen with heterozygous carriers of hemochromatosis variants -Significant iron overload is extremely rare in heterozygous carriers, but may be precipitated by concomitant alcoholic or nonalcoholic liver disease -Elevated ferritin could be from combination of fatty liver and inflammation from rheumatoid arthritis, as well as some contribution from H63D heterozygosity for hemochromatosis -Labs reviewed from 08/20/2020 showed elevated ferritin 711, iron saturation 53%, serum iron 173; hemoglobin normal at 13.9 -To assess degree of iron overload, we will obtain MRI of liver iron concentration - I personally discussed this with the patient's gastroenterology NP Roseanne Kaufman, who agrees.  We will defer scheduling the MRI to gastroenterology service, as they also plan to obtain MRCP at the same time. -Patient counseled to limit alcohol consumption, avoid excessive dietary iron, and avoid cooking food in cast iron panel -Phone visit in 4 weeks to discuss MRI results, RTC in 4-6 months with repeat ferritin/iron panel   PLAN SUMMARY & DISPOSITION: -Check MRI liver with iron concentration, to be scheduled by gastroenterology in conjunction with MRCP -Follow-up visit in 4 weeks -Office visit in 4 to 6 months, pending results of MRI, with repeat ferritin/iron panel, CBC obtained before appointment  All questions were answered. The patient knows to call the clinic with any problems, questions or concerns.  Medical decision making: Moderate (1 progressive chronic illness,  review of external notes from GI, review of test results, ordering of tests, and discussion of work-up with gastroenterology NP)  Time spent on visit: I spent 20 minutes counseling the patient face to face. The total time spent in the appointment was 30 minutes and more than 50% was on counseling.   Harriett Rush, PA-C  09/14/20 11:05 AM

## 2020-09-14 NOTE — Patient Instructions (Signed)
[place] at [facility] Discharge Instructions  You were seen today by Tarri Abernethy PA-C for your iron overload.  This is most likely reactive due to your liver disease and rheumatoid arthritis.  You do have the carrier gene for hereditary hemochromatosis, which causes iron overload in people who have two broken copies of that gene (you only have one broken copy).    OTHER TESTS: We would like to ge an MRI of your liver, and will coordinate with your GI office to schedule this.  MEDICATIONS: No changes  FOLLOW-UP APPOINTMENT: Phone visit in 4 weeks   Thank you for choosing [place] at [facility] to provide your oncology and hematology care.  To afford each patient quality time with our provider, please arrive at least 15 minutes before your scheduled appointment time.   If you have a lab appointment with the [place] please come in thru the Main Entrance and check in at the main information desk.  You need to re-schedule your appointment should you arrive 10 or more minutes late.  We strive to give you quality time with our providers, and arriving late affects you and other patients whose appointments are after yours.  Also, if you no show three or more times for appointments you may be dismissed from the clinic at the providers discretion.     Again, thank you for choosing [facility].  Our hope is that these requests will decrease the amount of time that you wait before being seen by our physicians.       _____________________________________________________________  Should you have questions after your visit to [facility], please contact our office at [phone] and follow the prompts.  Our office hours are 8:00 a.m. and 4:30 p.m. Monday - Friday.  Please note that voicemails left after 4:00 p.m. may not be returned until the following business day.  We are closed weekends and major holidays.  You do  have access to a nurse 24-7, just call the main number to the clinic [phone] and do not press any options, hold on the line and a nurse will answer the phone.    For prescription refill requests, have your pharmacy contact our office and allow 72 hours.    Due to Covid, you will need to wear a mask upon entering the hospital. If you do not have a mask, a mask will be given to you at the Main Entrance upon arrival. For doctor visits, patients may have 1 support person age 67 or older with them. For treatment visits, patients can not have anyone with them due to social distancing guidelines and our immunocompromised population.

## 2020-09-15 NOTE — Telephone Encounter (Signed)
Scheduled for 4/5 at 8:00am, arrival 7:30am, npo 4 hrs  Called pt and is aware of appt details. She voiced understanding.

## 2020-09-23 ENCOUNTER — Telehealth: Payer: Self-pay | Admitting: *Deleted

## 2020-09-23 NOTE — Telephone Encounter (Signed)
Called [facility] and LMOVM to call back for [name] to see if PA is required for MRI/MRCP

## 2020-09-23 NOTE — Patient Instructions (Signed)
[place]  09/23/2020     @PREFPERIOPPHARMACY @   Your procedure is scheduled on  09/28/2020   Report to [facility] at  [place].M.   Call this number if you have problems the morning of surgery:  [phone]   Remember:  Follow the diet and prep instructions given to you by the office.                        Take these medicines the morning of surgery with A SIP OF WATER  Pepecid, zofran (if needed), protonix, sulfasalazine.  DO NOT take any medications for diabetes the morning of your procedure.  If your glucose is 70 or below the morning of your procedure, drink 1/2 cup of clear juice and recheck your glucose in 15 minutes, if your glucose is still 70 or below, call [phone] for instructions.  If your glucose is 300 or above the morning of your procedure, call [phone] for instructions.     Please brush your teeth.  Do not wear jewelry, make-up or nail polish.  Do not wear lotions, powders, or perfumes, or deodorant.  Do not shave 48 hours prior to surgery.  Men may shave face and neck.  Do not bring valuables to the hospital.  [facility] is not responsible for any belongings or valuables.   Contacts, dentures or bridgework may not be worn into surgery.  Leave your suitcase in the car.  After surgery it may be brought to your room.  For patients admitted to the hospital, discharge time will be determined by your treatment team.  Patients discharged the day of surgery will not be allowed to drive home and must have someone with them for 24 hours.    Special instructions:  DO NOT smoke tobacco or vape the morning of your procedure.    Please read over the following fact sheets that you were given. Anesthesia Post-op Instructions and Care and Recovery After Surgery       Upper Endoscopy, Adult, Care After This sheet gives you information about how to care for yourself after your procedure. Your health care provider may also give you more  specific instructions. If you have problems or questions, contact your health care provider. What can I expect after the procedure? After the procedure, it is common to have:  A sore throat.  Mild stomach pain or discomfort.  Bloating.  Nausea. Follow these instructions at home:  Follow instructions from your health care provider about what to eat or drink after your procedure.  Return to your normal activities as told by your health care provider. Ask your health care provider what activities are safe for you.  Take over-the-counter and prescription medicines only as told by your health care provider.  If you were given a sedative during the procedure, it can affect you for several hours. Do not drive or operate machinery until your health care provider says that it is safe.  Keep all follow-up visits as told by your health care provider. This is important.   Contact a health care provider if you have:  A sore throat that lasts longer than one day.  Trouble swallowing. Get help right away if:  You vomit blood or your vomit looks like coffee grounds.  You have: ? A fever. ? Bloody, black, or tarry stools. ? A severe sore throat or you cannot swallow. ? Difficulty breathing. ? Severe pain  in your chest or abdomen. Summary  After the procedure, it is common to have a sore throat, mild stomach discomfort, bloating, and nausea.  If you were given a sedative during the procedure, it can affect you for several hours. Do not drive or operate machinery until your health care provider says that it is safe.  Follow instructions from your health care provider about what to eat or drink after your procedure.  Return to your normal activities as told by your health care provider. This information is not intended to replace advice given to you by your health care provider. Make sure you discuss any questions you have with your health care provider. Document Revised: 06/11/2019  Document Reviewed: 11/13/2017 Elsevier Patient Education  2021 [place].  https://www.asge.org/home/for-patients/patient-information/understanding-eso-dilation-updated">  Esophageal Dilatation Esophageal dilatation, also called esophageal dilation, is a procedure to widen or open a blocked or narrowed part of the esophagus. The esophagus is the part of the body that moves food and liquid from the mouth to the stomach. You may need this procedure if:  You have a buildup of scar tissue in your esophagus that makes it difficult, painful, or impossible to swallow. This can be caused by gastroesophageal reflux disease (GERD).  You have cancer of the esophagus.  There is a problem with how food moves through your esophagus. In some cases, you may need this procedure repeated at a later time to dilate the esophagus gradually. Tell a health care provider about:  Any allergies you have.  All medicines you are taking, including vitamins, herbs, eye drops, creams, and over-the-counter medicines.  Any problems you or family members have had with anesthetic medicines.  Any blood disorders you have.  Any surgeries you have had.  Any medical conditions you have.  Any antibiotic medicines you are required to take before dental procedures.  Whether you are pregnant or may be pregnant. What are the risks? Generally, this is a safe procedure. However, problems may occur, including:  Bleeding due to a tear in the lining of the esophagus.  A hole, or perforation, in the esophagus. What happens before the procedure?  Ask your health care provider about: ? Changing or stopping your regular medicines. This is especially important if you are taking diabetes medicines or blood thinners. ? Taking medicines such as aspirin and ibuprofen. These medicines can thin your blood. Do not take these medicines unless your health care provider tells you to take them. ? Taking over-the-counter medicines,  vitamins, herbs, and supplements.  Follow instructions from your health care provider about eating or drinking restrictions.  Plan to have a responsible adult take you home from the hospital or clinic.  Plan to have a responsible adult care for you for the time you are told after you leave the hospital or clinic. This is important. What happens during the procedure?  You may be given a medicine to help you relax (sedative).  A numbing medicine may be sprayed into the back of your throat, or you may gargle the medicine.  Your health care provider may perform the dilatation using various surgical instruments, such as: ? Simple dilators. This instrument is carefully placed in the esophagus to stretch it. ? Guided wire bougies. This involves using an endoscope to insert a wire into the esophagus. A dilator is passed over this wire to enlarge the esophagus. Then the wire is removed. ? Balloon dilators. An endoscope with a small balloon is inserted into the esophagus. The balloon is inflated to stretch  the esophagus and open it up. The procedure may vary among health care providers and hospitals. What can I expect after the procedure?  Your blood pressure, heart rate, breathing rate, and blood oxygen level will be monitored until you leave the hospital or clinic.  Your throat may feel slightly sore and numb. This will get better over time.  You will not be allowed to eat or drink until your throat is no longer numb.  When you are able to drink, urinate, and sit on the edge of the bed without nausea or dizziness, you may be able to return home. Follow these instructions at home:  Take over-the-counter and prescription medicines only as told by your health care provider.  If you were given a sedative during the procedure, it can affect you for several hours. Do not drive or operate machinery until your health care provider says that it is safe.  Plan to have a responsible adult care for you  for the time you are told. This is important.  Follow instructions from your health care provider about any eating or drinking restrictions.  Do not use any products that contain nicotine or tobacco, such as cigarettes, e-cigarettes, and chewing tobacco. If you need help quitting, ask your health care provider.  Keep all follow-up visits. This is important. Contact a health care provider if:  You have a fever.  You have pain that is not relieved by medicine. Get help right away if:  You have chest pain.  You have trouble breathing.  You have trouble swallowing.  You vomit blood.  You have black, tarry, or bloody stools. These symptoms may represent a serious problem that is an emergency. Do not wait to see if the symptoms will go away. Get medical help right away. Call your local emergency services (911 in the [place] Do not drive yourself to the hospital. Summary  Esophageal dilatation, also called esophageal dilation, is a procedure to widen or open a blocked or narrowed part of the esophagus.  Plan to have a responsible adult take you home from the hospital or clinic.  For this procedure, a numbing medicine may be sprayed into the back of your throat, or you may gargle the medicine.  Do not drive or operate machinery until your health care provider says that it is safe. This information is not intended to replace advice given to you by your health care provider. Make sure you discuss any questions you have with your health care provider. Document Revised: 10/30/2019 Document Reviewed: 10/30/2019 Elsevier Patient Education  2021 [place] After This sheet gives you information about how to care for yourself after your procedure. Your health care provider may also give you more specific instructions. If you have problems or questions, contact your health care provider. What can I expect after the procedure? After the procedure, it is common  to have:  Tiredness.  Forgetfulness about what happened after the procedure.  Impaired judgment for important decisions.  Nausea or vomiting.  Some difficulty with balance. Follow these instructions at home: For the time period you were told by your health care provider:  Rest as needed.  Do not participate in activities where you could fall or become injured.  Do not drive or use machinery.  Do not drink alcohol.  Do not take sleeping pills or medicines that cause drowsiness.  Do not make important decisions or sign legal documents.  Do not take care of children on your own.  Eating and drinking  Follow the diet that is recommended by your health care provider.  Drink enough fluid to keep your urine pale yellow.  If you vomit: ? Drink water, juice, or soup when you can drink without vomiting. ? Make sure you have little or no nausea before eating solid foods. General instructions  Have a responsible adult stay with you for the time you are told. It is important to have someone help care for you until you are awake and alert.  Take over-the-counter and prescription medicines only as told by your health care provider.  If you have sleep apnea, surgery and certain medicines can increase your risk for breathing problems. Follow instructions from your health care provider about wearing your sleep device: ? Anytime you are sleeping, including during daytime naps. ? While taking prescription pain medicines, sleeping medicines, or medicines that make you drowsy.  Avoid smoking.  Keep all follow-up visits as told by your health care provider. This is important. Contact a health care provider if:  You keep feeling nauseous or you keep vomiting.  You feel light-headed.  You are still sleepy or having trouble with balance after 24 hours.  You develop a rash.  You have a fever.  You have redness or swelling around the IV site. Get help right away if:  You  have trouble breathing.  You have new-onset confusion at home. Summary  For several hours after your procedure, you may feel tired. You may also be forgetful and have poor judgment.  Have a responsible adult stay with you for the time you are told. It is important to have someone help care for you until you are awake and alert.  Rest as told. Do not drive or operate machinery. Do not drink alcohol or take sleeping pills.  Get help right away if you have trouble breathing, or if you suddenly become confused. This information is not intended to replace advice given to you by your health care provider. Make sure you discuss any questions you have with your health care provider. Document Revised: 02/27/2020 Document Reviewed: 05/16/2019 Elsevier Patient Education  2021 [employer].

## 2020-09-23 NOTE — Telephone Encounter (Signed)
Teresa Mccoy called back and PA is required. Clinicals faxed to her

## 2020-09-24 ENCOUNTER — Encounter (HOSPITAL_COMMUNITY): Payer: Self-pay

## 2020-09-24 ENCOUNTER — Other Ambulatory Visit (HOSPITAL_COMMUNITY)
Admission: RE | Admit: 2020-09-24 | Discharge: 2020-09-24 | Disposition: A | Discharge status: Home / Self Care | Payer: PRIVATE HEALTH INSURANCE | Source: Ambulatory Visit | Attending: Internal Medicine | Admitting: Internal Medicine

## 2020-09-24 ENCOUNTER — Encounter (HOSPITAL_COMMUNITY)
Admission: RE | Admit: 2020-09-24 | Discharge: 2020-09-24 | Disposition: A | Discharge status: Home / Self Care | Payer: PRIVATE HEALTH INSURANCE | Source: Ambulatory Visit | Attending: Internal Medicine | Admitting: Internal Medicine

## 2020-09-24 ENCOUNTER — Other Ambulatory Visit: Payer: Self-pay

## 2020-09-24 DIAGNOSIS — Z20822 Contact with and (suspected) exposure to covid-19: Secondary | ICD-10-CM | POA: Diagnosis not present

## 2020-09-24 DIAGNOSIS — Z01818 Encounter for other preprocedural examination: Secondary | ICD-10-CM | POA: Insufficient documentation

## 2020-09-24 LAB — BASIC METABOLIC PANEL
Anion gap: 9 (ref 5–15)
BUN: 14 mg/dL (ref 6–20)
CO2: 26 mmol/L (ref 22–32)
Calcium: 9.9 mg/dL (ref 8.9–10.3)
Chloride: 103 mmol/L (ref 98–111)
Creatinine, Ser: 0.63 mg/dL (ref 0.44–1.00)
GFR, Estimated: >60 mL/min (ref >60)
Glucose, Bld: 98 mg/dL (ref 70–99)
Potassium: 4.4 mmol/L (ref 3.5–5.1)
Sodium: 138 mmol/L (ref 135–145)

## 2020-09-24 NOTE — Telephone Encounter (Signed)
PA approved. Auth# [id number] DOS 09/29/20

## 2020-09-25 LAB — SARS CORONAVIRUS 2 (TAT 6-24 HRS): SARS Coronavirus 2: NEGATIVE

## 2020-09-28 ENCOUNTER — Ambulatory Visit (HOSPITAL_COMMUNITY): Payer: PRIVATE HEALTH INSURANCE | Admitting: Anesthesiology

## 2020-09-28 ENCOUNTER — Ambulatory Visit (HOSPITAL_COMMUNITY)
Admission: RE | Admit: 2020-09-28 | Discharge: 2020-09-28 | Disposition: A | Discharge status: Home / Self Care | Payer: PRIVATE HEALTH INSURANCE | Attending: Internal Medicine | Admitting: Internal Medicine

## 2020-09-28 ENCOUNTER — Encounter (HOSPITAL_COMMUNITY)
Admission: RE | Disposition: A | Discharge status: Home / Self Care | Payer: Self-pay | Source: Home / Self Care | Attending: Internal Medicine

## 2020-09-28 DIAGNOSIS — Z8719 Personal history of other diseases of the digestive system: Secondary | ICD-10-CM | POA: Diagnosis present

## 2020-09-28 DIAGNOSIS — F17210 Nicotine dependence, cigarettes, uncomplicated: Secondary | ICD-10-CM | POA: Diagnosis not present

## 2020-09-28 DIAGNOSIS — R1310 Dysphagia, unspecified: Secondary | ICD-10-CM | POA: Diagnosis not present

## 2020-09-28 DIAGNOSIS — R1314 Dysphagia, pharyngoesophageal phase: Secondary | ICD-10-CM | POA: Diagnosis present

## 2020-09-28 DIAGNOSIS — Z7984 Long term (current) use of oral hypoglycemic drugs: Secondary | ICD-10-CM | POA: Diagnosis not present

## 2020-09-28 DIAGNOSIS — K449 Diaphragmatic hernia without obstruction or gangrene: Secondary | ICD-10-CM | POA: Insufficient documentation

## 2020-09-28 DIAGNOSIS — Z7952 Long term (current) use of systemic steroids: Secondary | ICD-10-CM | POA: Diagnosis not present

## 2020-09-28 HISTORY — PX: MALONEY DILATION: SHX5535

## 2020-09-28 HISTORY — PX: ESOPHAGOGASTRODUODENOSCOPY (EGD) WITH PROPOFOL: SHX5813

## 2020-09-28 LAB — GLUCOSE, CAPILLARY
Glucose-Capillary: 127 mg/dL — ABNORMAL HIGH (ref 70–99)
Glucose-Capillary: 108 mg/dL — ABNORMAL HIGH (ref 70–99)

## 2020-09-28 SURGERY — ESOPHAGOGASTRODUODENOSCOPY (EGD) WITH PROPOFOL
Anesthesia: General

## 2020-09-28 MED ORDER — PROPOFOL 10 MG/ML IV BOLUS
INTRAVENOUS | Status: DC | PRN
  Administered 2020-09-28: 200 mg via INTRAVENOUS
  Administered 2020-09-28: 100 mg via INTRAVENOUS
  Administered 2020-09-28: 50 mg via INTRAVENOUS

## 2020-09-28 MED ORDER — LACTATED RINGERS IV SOLN: INTRAVENOUS | Status: DC

## 2020-09-28 MED ORDER — STERILE WATER FOR IRRIGATION IR SOLN
Status: DC | PRN
  Administered 2020-09-28: 100 mL

## 2020-09-28 NOTE — Discharge Instructions (Signed)
EGD Discharge instructions Please read the instructions outlined below and refer to this sheet in the next few weeks. These discharge instructions provide you with general information on caring for yourself after you leave the hospital. Your doctor may also give you specific instructions. While your treatment has been planned according to the most current medical practices available, unavoidable complications occasionally occur. If you have any problems or questions after discharge, please call your doctor. ACTIVITY  You may resume your regular activity but move at a slower pace for the next 24 hours.   Take frequent rest periods for the next 24 hours.   Walking will help expel (get rid of) the air and reduce the bloated feeling in your abdomen.   No driving for 24 hours (because of the anesthesia (medicine) used during the test).   You may shower.   Do not sign any important legal documents or operate any machinery for 24 hours (because of the anesthesia used during the test).  NUTRITION  Drink plenty of fluids.   You may resume your normal diet.   Begin with a light meal and progress to your normal diet.   Avoid alcoholic beverages for 24 hours or as instructed by your caregiver.  MEDICATIONS  You may resume your normal medications unless your caregiver tells you otherwise.  WHAT YOU CAN EXPECT TODAY  You may experience abdominal discomfort such as a feeling of fullness or "gas" pains.  FOLLOW-UP  Your doctor will discuss the results of your test with you.  SEEK IMMEDIATE MEDICAL ATTENTION IF ANY OF THE FOLLOWING OCCUR:  Excessive nausea (feeling sick to your stomach) and/or vomiting.   Severe abdominal pain and distention (swelling).   Trouble swallowing.   Temperature over 101 F (37.8 C).   Rectal bleeding or vomiting of blood.   Your esophagus was stretched today.  You have a small hiatal hernia.  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 Your health care provider may also give you more specific instructions. If you have problems or questions, contact your health care provider. What can I expect after the procedure? After the procedure, it is common to have:  Tiredness.  Forgetfulness about what happened after the procedure.  Impaired judgment for important decisions.  Nausea or vomiting.  Some difficulty with balance. Follow these instructions at home: For the time period you were told by your health care provider:  Rest as needed.  Do not participate in activities where you could fall or become injured.  Do not drive or use machinery.  Do not drink alcohol.  Do not take sleeping pills or medicines that cause drowsiness.  Do not make important decisions or sign legal documents.  Do not take care of children on your own.      Eating and drinking  Follow the diet that is recommended by your health care provider.  Drink enough fluid to keep your urine pale yellow.  If you vomit: ? Drink water, juice, or soup when you can drink without vomiting. ? Make sure you have little or no nausea before eating solid foods. General instructions  Have a responsible adult stay with  you for the time you are told. It is important to have someone help care for you until you are awake and alert.  Take over-the-counter and prescription medicines only as told by your health care provider.  If you have sleep apnea, surgery and certain medicines can increase your risk for breathing problems. Follow instructions from your health care provider about wearing your sleep device: ? Anytime you are sleeping,  including during daytime naps. ? While taking prescription pain medicines, sleeping medicines, or medicines that make you drowsy.  Avoid smoking.  Keep all follow-up visits as told by your health care provider. This is important. Contact a health care provider if:  You keep feeling nauseous or you keep vomiting.  You feel light-headed.  You are still sleepy or having trouble with balance after 24 hours.  You develop a rash.  You have a fever.  You have redness or swelling around the IV site. Get help right away if:  You have trouble breathing.  You have new-onset confusion at home. Summary  For several hours after your procedure, you may feel tired. You may also be forgetful and have poor judgment.  Have a responsible adult stay with you for the time you are told. It is important to have someone help care for you until you are awake and alert.  Rest as told. Do not drive or operate machinery. Do not drink alcohol or take sleeping pills.  Get help right away if you have trouble breathing, or if you suddenly become confused. This information is not intended to replace advice given to you by your health care provider. Make sure you discuss any questions you have with your health care provider. Document Revised: 02/27/2020 Document Reviewed: 05/16/2019 Elsevier Patient Education  2021 [employer].

## 2020-09-28 NOTE — H&P (Signed)
@LOGO @   Primary Care Physician:  Marylee Floras, FNP Primary Gastroenterologist:  Dr. Gala Romney  Pre-Procedure History & Physical: HPI:  Teresa Mccoy is a 55 y.o. 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 Past Medical History:  Diagnosis Date  . Anxiety   . Chest pain   . Depression   . Diabetes mellitus without complication ([place])   . Diverticulitis   . Hypercholesterolemia   . Hypertension   . Lower back pain   . Rheumatoid arthritis ([place])   . Sleep apnea    on CPAP    Past Surgical History:  Procedure Laterality Date  . ABDOMINAL HYSTERECTOMY    . BIOPSY  09/23/2019   Procedure: BIOPSY;  Surgeon: Daneil Dolin, MD;  Location: [facility];  Service: Endoscopy;;  duodenum  . CHOLECYSTECTOMY    . COLONOSCOPY    . ESOPHAGOGASTRODUODENOSCOPY  12/21/2016   [facility]; normal esophagus, small hiatal hernia, nonbleeding erosions in the gastric antrum/B biopsied, normal pylorus, normal duodenum.  Pathology with chronic nonspecific gastritis, H. pylori negative.  . ESOPHAGOGASTRODUODENOSCOPY (EGD) WITH PROPOFOL N/A 09/23/2019   normal esophagus, normal stomach, abnormal duodenum of uncertain significance s/p biopsy. Path with small duodenal adenoma. Needs surveillance EGD now.   . OTHER SURGICAL HISTORY     HYSTERECTOMY 06/27/1997  . 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 Authorizing Provider  famotidine (PEPCID) 40 MG tablet Take by mouth. 01/20/20  Yes [provider]  FARXIGA 5 MG TABS tablet Take 5 mg by mouth daily. 08/06/20  Yes [provider]  ibuprofen (ADVIL) 200 MG tablet Take 400 mg by mouth every 8 (eight) hours as needed for moderate pain.   Yes [provider]  lisinopril (ZESTRIL) 20 MG tablet lisinopril 20 mg tablet  Take 1 tablet  every day by oral route for 90 days.   Yes [provider]  metFORMIN (GLUCOPHAGE) 1000 MG tablet Take by mouth.   Yes [provider]  ondansetron (ZOFRAN) 4 MG tablet Take 1 tablet (4 mg total) by mouth every 8 (eight) hours as needed for nausea or vomiting. 08/26/20  Yes Annitta Needs, NP  pantoprazole (PROTONIX) 40 MG tablet Take 1 tablet (40 mg total) by mouth 2 (two) times daily before a meal. Patient taking differently: Take 40 mg by mouth daily as needed (acid reflux). 08/28/20 08/28/21 Yes Annitta Needs, NP  predniSONE (DELTASONE) 10 MG tablet Take 10 mg by mouth daily.    Yes [provider]  Molnupiravir 200 MG CAPS TAKE 4 CAPSULES BY MOUTH EVERY 12 HOURS FOR 5 DAYS 07/29/20   [provider]  sulfaSALAzine (AZULFIDINE) 500 MG tablet Take by mouth.    [provider]    Allergies as of 08/20/2020  . (No Known Allergies)    Family History  Problem Relation Age of Onset  . Hypertension Mother   . Fibromyalgia Mother   . Anxiety disorder Mother   . Parkinson's disease Mother   . Hypertension Father   . Diabetes Father   . Heart disease Father   . Coronary artery disease Father   . Parkinson's disease Maternal Uncle   . Parkinson's disease Maternal Grandmother   . Alzheimer's disease Paternal Grandmother   . Sleep apnea Son   . Colon cancer Neg Hx  Social History   Socioeconomic History  . Marital status: Married    Spouse name: Not on file  . Number of children: 2  . Years of education: Not on file  . Highest education level: Not on file  Occupational History    Comment: [employer]  Tobacco Use  . Smoking status: Current Some Day Smoker    Types: Cigarettes    Last attempt to quit: 06/28/2003    Years since quitting: 17.2  . Smokeless tobacco: Never Used  . Tobacco comment: vapes  Vaping Use  . Vaping Use: Every day  . Substances: Nicotine, CBD, Flavoring  Substance and Sexual Activity  . Alcohol use: Yes     Comment: socially; Friday/Saturday beer 6-7 beers   . Drug use: Not Currently  . Sexual activity: Yes  Other Topics Concern  . Not on file  Social History Narrative  . Not on file   Social Determinants of Health   Financial Resource Strain: Not on file  Food Insecurity: Not on file  Transportation Needs: No Transportation Needs  . Lack of Transportation (Medical): No  . Lack of Transportation (Non-Medical): No  Physical Activity: Inactive  . Days of Exercise per Week: 0 days  . Minutes of Exercise per Session: 0 min  Stress: Not on file  Social Connections: Not on file  Intimate Partner Violence: Not on file    Review of Systems: See HPI, otherwise negative ROS  Physical Exam: BP 124/69   Pulse 65   Temp 98.4 F (36.9 C)   Resp (!) 21   SpO2 98%  General:   Alert,  Well-developed, well-nourished, pleasant and cooperative in NAD Neck:  Supple; no masses or thyromegaly. No significant cervical adenopathy. Lungs:  Clear throughout to auscultation.   No wheezes, crackles, or rhonchi. No acute distress. Heart:  Regular rate and rhythm; no murmurs, clicks, rubs,  or gallops. Abdomen: Non-distended, normal bowel sounds.  Soft and nontender without appreciable mass or hepatosplenomegaly.  Pulses:  Normal pulses noted. Extremities:  Without clubbing or edema.  Impression/Plan: 55 year old lady with vague esophageal dysphagia upper abdominal pain.  History of small duodenal adenoma removed with cold biopsy forceps last year. Here for repeat EGD with esophageal dilation as feasible/appropriate per plan as well as surveillance of the duodenal lesion previously removed The risks, benefits, limitations, alternatives and imponderables have been reviewed with the patient. Potential for esophageal dilation, biopsy, etc. have also been reviewed.  Questions have been answered. All parties agreeable.     Notice: This dictation was prepared with Dragon dictation along with smaller phrase  technology. Any transcriptional errors that result from this process are unintentional and may not be corrected upon review.

## 2020-09-28 NOTE — Anesthesia Postprocedure Evaluation (Signed)
Anesthesia Post Note  Patient: Ciearra Rufo [facility]  Procedure(s) Performed: ESOPHAGOGASTRODUODENOSCOPY (EGD) WITH PROPOFOL (N/A ) MALONEY DILATION (N/A )  Patient location during evaluation: Short Stay Anesthesia Type: General Level of consciousness: awake and alert and patient cooperative Pain management: satisfactory to patient Vital Signs Assessment: post-procedure vital signs reviewed and stable Respiratory status: spontaneous breathing Cardiovascular status: stable Postop Assessment: no apparent nausea or vomiting Anesthetic complications: no   No complications documented.   Last Vitals:  Vitals:   09/28/20 0946 09/28/20 1113  BP: 124/69 126/70  Pulse: 65 75  Resp: (!) 21 (!) 21  Temp:  36.6 C  SpO2: 98% 97%    Last Pain:  Vitals:   09/28/20 1113  TempSrc: Oral  PainSc: 6                  Lynita Groseclose

## 2020-09-28 NOTE — Anesthesia Procedure Notes (Signed)
Date/Time: 09/28/2020 10:51 AM Performed by: Vista Deck, CRNA Pre-anesthesia Checklist: Patient identified, Emergency Drugs available, Suction available, Timeout performed and Patient being monitored Patient Re-evaluated:Patient Re-evaluated prior to induction Oxygen Delivery Method: Non-rebreather mask

## 2020-09-28 NOTE — Op Note (Signed)
[facility] Patient Name: Teresa Mccoy Procedure Date: 09/28/2020 10:41 AM MRN: [id number] Date of Birth: 02-Nov-1965 Attending MD: Norvel Richards , MD CSN: [id number] Age: 55 Admit Type: Outpatient Procedure:                Upper GI endoscopy Indications:              Surveillance procedure, Dysphagia Providers:                Norvel Richards, MD, Rosina Lowenstein, RN, Nelma Rothman, Technician Referring MD:              Medicines:                Propofol per Anesthesia Complications:            No immediate complications. Estimated Blood Loss:     Estimated blood loss: none. Estimated blood loss                            was minimal. Procedure:                Pre-Anesthesia Assessment:                           - Prior to the procedure, a History and Physical                            was performed, and patient medications and                            allergies were reviewed. The patient's tolerance of                            previous anesthesia was also reviewed. The risks                            and benefits of the procedure and the sedation                            options and risks were discussed with the patient.                            All questions were answered, and informed consent                            was obtained. Prior Anticoagulants: The patient has                            taken no previous anticoagulant or antiplatelet                            agents. ASA Grade Assessment: III - A patient with  severe systemic disease. After reviewing the risks                            and benefits, the patient was deemed in                            satisfactory condition to undergo the procedure.                           After obtaining informed consent, the endoscope was                            passed under direct vision. Throughout the                            procedure, the patient's  blood pressure, pulse, and                            oxygen saturations were monitored continuously. The                            [id number]-H190 ([id number]) scope was introduced through the                            mouth, and advanced to the second part of duodenum.                            The upper GI endoscopy was accomplished without                            difficulty. The patient tolerated the procedure                            well. Scope In: 10:57:17 AM Scope Out: 11:04:44 AM Total Procedure Duration: 0 hours 7 minutes 27 seconds  Findings:      The examined esophagus was normal.      A small hiatal hernia was present. Normal-appearing first second third       portion of the duodenum. No evidence of any residual duodenal adenoma or       other abnormality.      The exam was otherwise without abnormality. The scope was withdrawn.       Dilation was performed with a Maloney dilator with mild resistance at 58       Fr. The dilation site was examined following endoscope reinsertion and       showed no change. Impression:               - Normal esophagus. - Small hiatal hernia. Status                            post esophageal dilation                           - The examination was otherwise normal.                           -  No evidence of residual adenoma Moderate Sedation:      Moderate (conscious) sedation was personally administered by an       anesthesia professional. The following parameters were monitored: oxygen       saturation, heart rate, blood pressure, respiratory rate, EKG, adequacy       of pulmonary ventilation, and response to care. Recommendation:           - Patient has a contact number available for                            emergencies. The signs and symptoms of potential                            delayed complications were discussed with the                            patient. Return to normal activities tomorrow.                            Written  discharge instructions were provided to the                            patient.                           - Resume previous diet.                           - Continue present medications.                           - Return to my office in 2 months. Procedure Code(s):        --- Professional ---                           [id number], Esophagogastroduodenoscopy, flexible,                            transoral; diagnostic, including collection of                            specimen(s) by brushing or washing, when performed                            (separate procedure)                           43450, Dilation of esophagus, by unguided sound or                            bougie, single or multiple passes Diagnosis Code(s):        --- Professional ---                           K44.9, Diaphragmatic hernia without obstruction or  gangrene                           R13.10, Dysphagia, unspecified CPT copyright 2019 American Medical Association. All rights reserved. The codes documented in this report are preliminary and upon coder review may  be revised to meet current compliance requirements. Cristopher Estimable. Chonda Baney, MD Norvel Richards, MD 09/28/2020 11:17:14 AM This report has been signed electronically. Number of Addenda: 0

## 2020-09-28 NOTE — Anesthesia Postprocedure Evaluation (Signed)
Anesthesia Post Note  Patient: Lynann Demetrius [facility]  Procedure(s) Performed: ESOPHAGOGASTRODUODENOSCOPY (EGD) WITH PROPOFOL (N/A ) MALONEY DILATION (N/A )  Patient location during evaluation: PACU Anesthesia Type: General Level of consciousness: awake Pain management: pain level controlled Vital Signs Assessment: post-procedure vital signs reviewed and stable Respiratory status: spontaneous breathing and respiratory function stable Cardiovascular status: blood pressure returned to baseline and stable Postop Assessment: no headache and no apparent nausea or vomiting Anesthetic complications: no   No complications documented.   Last Vitals:  Vitals:   09/28/20 0946 09/28/20 1113  BP: 124/69 126/70  Pulse: 65 75  Resp: (!) 21 (!) 21  Temp:  36.6 C  SpO2: 98% 97%    Last Pain:  Vitals:   09/28/20 1113  TempSrc: Oral  PainSc: [place]

## 2020-09-28 NOTE — Transfer of Care (Signed)
Immediate Anesthesia Transfer of Care Note  Patient: [place]  Procedure(s) Performed: ESOPHAGOGASTRODUODENOSCOPY (EGD) WITH PROPOFOL (N/A ) MALONEY DILATION (N/A )  Patient Location: Short Stay  Anesthesia Type:General  Level of Consciousness: awake and patient cooperative  Airway & Oxygen Therapy: Patient Spontanous Breathing  Post-op Assessment: Report given to RN and Post -op Vital signs reviewed and stable  Post vital signs: Reviewed and stable  Last Vitals:  Vitals Value Taken Time  BP    Temp    Pulse    Resp    SpO2     SEE PACU FLOW SHEET Last Pain:  Vitals:   09/28/20 1051  PainSc: 0-No pain         Complications: No complications documented.

## 2020-09-28 NOTE — Anesthesia Preprocedure Evaluation (Signed)
Anesthesia Evaluation  Patient identified by MRN, date of birth, ID band Patient awake    Reviewed: Allergy & Precautions, NPO status , Patient's Chart, lab work & pertinent test results  History of Anesthesia Complications Negative for: history of anesthetic complications  Airway Mallampati: II  TM Distance: >3 FB Neck ROM: Full    Dental  (+) Teeth Intact, Dental Advisory Given   Pulmonary sleep apnea and Continuous Positive Airway Pressure Ventilation , Current Smoker and Patient abstained from smoking.,    Pulmonary exam normal breath sounds clear to auscultation       Cardiovascular METS: 3 - Mets hypertension, Pt. on medications Normal cardiovascular exam Rhythm:Regular Rate:Normal     Neuro/Psych PSYCHIATRIC DISORDERS Anxiety Depression    GI/Hepatic Neg liver ROS, GERD  Medicated and Controlled,  Endo/Other  diabetes, Well Controlled, Type 2, Oral Hypoglycemic Agents  Renal/GU negative Renal ROS     Musculoskeletal  (+) Arthritis , Osteoarthritis,    Abdominal   Peds  Hematology   Anesthesia Other Findings   Reproductive/Obstetrics                             Anesthesia Physical  Anesthesia Plan  ASA: II  Anesthesia Plan: General   Post-op Pain Management:    Induction: Intravenous  PONV Risk Score and Plan: TIVA, Treatment may vary due to age or medical condition and Propofol infusion  Airway Management Planned: Nasal Cannula, Natural Airway and Simple Face Mask  Additional Equipment:   Intra-op Plan:   Post-operative Plan:   Informed Consent:   Plan Discussed with: CRNA  Anesthesia Plan Comments:         Anesthesia Quick Evaluation

## 2020-09-29 ENCOUNTER — Ambulatory Visit (HOSPITAL_COMMUNITY)
Admission: RE | Admit: 2020-09-29 | Discharge: 2020-09-29 | Disposition: A | Discharge status: Home / Self Care | Payer: PRIVATE HEALTH INSURANCE | Source: Ambulatory Visit | Attending: Gastroenterology | Admitting: Gastroenterology

## 2020-09-29 ENCOUNTER — Other Ambulatory Visit: Payer: Self-pay | Admitting: Gastroenterology

## 2020-09-29 DIAGNOSIS — R7989 Other specified abnormal findings of blood chemistry: Secondary | ICD-10-CM | POA: Insufficient documentation

## 2020-09-29 MED ORDER — GADOBUTROL 1 MMOL/ML IV SOLN
10.0000 mL | Freq: Once | INTRAVENOUS | Status: AC | PRN
  Administered 2020-09-29: 10 mL via INTRAVENOUS

## 2020-10-02 ENCOUNTER — Encounter (HOSPITAL_COMMUNITY): Payer: Self-pay | Admitting: Internal Medicine

## 2020-10-12 ENCOUNTER — Other Ambulatory Visit: Payer: Self-pay

## 2020-10-12 ENCOUNTER — Inpatient Hospital Stay (HOSPITAL_COMMUNITY): Payer: PRIVATE HEALTH INSURANCE | Attending: Physician Assistant | Admitting: Physician Assistant

## 2020-10-12 ENCOUNTER — Encounter (HOSPITAL_COMMUNITY): Payer: Self-pay | Admitting: Physician Assistant

## 2020-10-20 ENCOUNTER — Ambulatory Visit: Payer: PRIVATE HEALTH INSURANCE | Admitting: Gastroenterology

## 2020-10-26 ENCOUNTER — Inpatient Hospital Stay (HOSPITAL_COMMUNITY): Payer: PRIVATE HEALTH INSURANCE | Attending: Physician Assistant | Admitting: Physician Assistant

## 2020-10-26 ENCOUNTER — Other Ambulatory Visit: Payer: Self-pay

## 2020-10-26 ENCOUNTER — Encounter (HOSPITAL_COMMUNITY): Payer: Self-pay

## 2020-10-26 DIAGNOSIS — R7989 Other specified abnormal findings of blood chemistry: Secondary | ICD-10-CM

## 2020-10-26 NOTE — Progress Notes (Signed)
Virtual Visit via Telephone Note [facility]  I connected with Teresa Mccoy  on 10/26/20 at 3:19 PM by telephone and verified that I am speaking with the correct person using two identifiers.  Location: Patient: Home Provider: [facility]   I discussed the limitations, risks, security and privacy concerns of performing an evaluation and management service by telephone and the availability of in person appointments. I also discussed with the patient that there may be a patient responsible charge related to this service. The patient expressed understanding and agreed to proceed.   History of Present Illness: Ms. Shakeia Krus [facility] returns for follow-up of her elevated ferritin and to discuss results of her liver MRI that was obtained on 09/29/2020.  I last saw her in office on 09/14/2020.  She reports that she is having a severe rheumatoid arthritis flare right now, and has an appointment to see her rheumatologist next week.  She reports diffuse joint pain and swelling, particularly in her hands, reports that she is struggling to perform simple tasks around the house.  She continues to report extreme fatigue and lethargy.  Reports that her energy on an average day is at about 10% percent.  She reports 50% percent appetite.  She does complain of some early satiety.  She continues to slowly lose weight due to poor appetite.  Ms. Bango continues to follow-up with GI for her elevated LFTs of undetermined etiology as well as her persistent right upper quadrant pain.   EGD with esophageal dilation was performed on 09/28/2020, and showed small hiatal hernia, but was otherwise unremarkable.  Her next appointment with GI is in June.  At her last visit, we had discussed that she needs to cut down on her alcohol intake, as she had been drinking about 5 beers every Saturday.  She reports that she will try to cut back, but has not done so yet.  She has stopped cooking in cast iron  pans.     Observations/Objective: Review of Systems  Constitutional: Positive for malaise/fatigue and weight loss. Negative for chills, diaphoresis and fever.  Respiratory: Negative for cough and shortness of breath.   Cardiovascular: Negative for chest pain, palpitations and leg swelling.  Gastrointestinal: Negative for abdominal pain, nausea and vomiting.  Skin: Negative for itching and rash.    PHYSICAL EXAM (per limitations of virtual telephone visit): The patient is alert and oriented x 3, exhibiting adequate mentation, good mood, and ability to speak in full sentences and execute sound judgement.   Assessment: 1.  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 -Hepatitis B and C serology was negative. 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  C282Y was negative.  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 -CT scan of the abdomen and pelvis on 07/02/2019 showed decreased attenuation in the liver consistent with fatty liver.  Spleen size was normal. -Abdominal ultrasound on 08/27/2020 shows increased echogenicity of liver consistent with fatty infiltration of the liver, no splenomegaly. -MRI abdomen (09/29/2020) was negative for any signs of iron overload in the liver -Moderately increased ferritin level may be seen with heterozygous carriers of hemochromatosis variants -Significant iron overload is extremely rare in heterozygous carriers, but may be precipitated by concomitant alcoholic or nonalcoholic liver disease -Elevated ferritin could be from combination of fatty liver disease and inflammation from  rheumatoid arthritis, as well as some contribution from H63D heterozygosity for hemochromatosis  2.  Rheumatoid arthritis -This affects her small joints of the hands and elbows -She follows with Dr. Scarlette Shorts in  [place] -Reportedly taking sulfasalazine and prednisone -Likely contributing to her elevated ferritin   3.  Social/family history -No family history of hemochromatosis.   -Maternal uncle had cirrhosis from drinking alcohol.   -Family history positive for liver cancer and lung cancer.   -She drinks beer over the weekend.     Plan: 1.  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 -Moderately increased ferritin level may be seen with heterozygous carriers of hemochromatosis variants -Significant iron overload is extremely rare in heterozygous carriers, but may be precipitated by concomitant alcoholic or nonalcoholic liver disease -Elevated ferritin could be from combination of fatty liver and inflammation from rheumatoid arthritis, as well as some contribution from H63D heterozygosity for hemochromatosis -Labs reviewed from 08/20/2020 showed elevated ferritin 711, iron saturation 53%, serum iron 173; hemoglobin normal at 13.9 -MRI liver was negative for iron overload, patient continuing to follow-up with GI for ongoing abdominal symptoms -Patient counseled to limit alcohol consumption, avoid excessive dietary iron, and avoid cooking food in cast iron panel -RTC in 4 months with repeat ferritin/iron panel, CBC, CMP    Follow Up Instructions: -Limit alcohol intake (cessation of alcohol strongly encouraged) -Continue follow-up with GI for abdominal complaints -RTC in 4 months for labs and follow-up  Medical Decision Making: Low   I discussed the assessment and treatment plan with the patient. The patient was provided an opportunity to ask questions and all were answered. The patient agreed with the plan and demonstrated an understanding of the instructions.   The patient was advised to call back or seek an in-person evaluation if the symptoms worsen or if the condition fails to improve as anticipated.  I provided 9 minutes of non-face-to-face time during this  encounter.   Harriett Rush, PA-C 10/26/20 3:40 PM

## 2020-11-26 ENCOUNTER — Encounter: Payer: Self-pay | Admitting: Gastroenterology

## 2020-11-26 ENCOUNTER — Telehealth: Payer: Self-pay | Admitting: *Deleted

## 2020-11-26 ENCOUNTER — Telehealth: Payer: Self-pay | Admitting: Gastroenterology

## 2020-11-26 ENCOUNTER — Telehealth (INDEPENDENT_AMBULATORY_CARE_PROVIDER_SITE_OTHER): Payer: Self-pay | Admitting: Gastroenterology

## 2020-11-26 DIAGNOSIS — R7989 Other specified abnormal findings of blood chemistry: Secondary | ICD-10-CM

## 2020-11-26 NOTE — Telephone Encounter (Signed)
Order placed. Called central scheduling, spoke with Keri. She is aware of order and will place for review.

## 2020-11-26 NOTE — Progress Notes (Signed)
Primary Care Physician:  Marylee Floras, FNP  Primary GI: Dr. Gala Romney  Patient Location: Home   Provider Location: [facility] office   Reason for Visit: Follow-up   Persons present on the virtual encounter, with roles: Patient and NP   Total time (minutes) spent on medical discussion: 10 minutes   Due to COVID-19, visit was conducted using virtual method.  Visit was requested by patient.  Virtual Visit via Telephone Note Due to COVID-19, visit is conducted virtually and was requested by patient.   I connected with [place] on 11/26/20 at 10:00 AM EDT by telephone and verified that I am speaking with the correct person using two identifiers.   I discussed the limitations, risks, security and privacy concerns of performing an evaluation and management service by telephone and the availability of in person appointments. I also discussed with the patient that there may be a patient responsible charge related to this service. The patient expressed understanding and agreed to proceed.  Chief Complaint  Patient presents with  . Bloated    Gets full soon, but has helped her to lose weight     History of Present Illness: Pleasant 55 year old female presenting today with a history of abdominal pain, bloating, nausea, elevated transaminases, s/p EGD last year with normal esophagus, normal stomach, abnormal duodenum of uncertain significance s/p biopsy. Path with small duodenal adenoma. Surveillance EGD April 2022 with small hiatal hernia, dilation of esophagus, otherwise normal, no evidence of residual adenoma. Normal GES on file.   Elevated transaminases: thorough evaluation to include negative autoimmune/PBC, ceruloplasmin. Hep C antibody negative, Hep B surface antigen negative. Needs Hep A/B vaccinations. Elevated ferritin and followed by Hematology. Felt to be moreso related to fatty liver, ?alcohol intake, RA. MRCP April 2022 without acute findings. No biliary obstruction.    Transaminases have been in the low 100s for past year consistently.   RUQ pain not as often as previously. Still feels full with small amounts of food. No nausea. Feels full. No postprandial pain. As soon as starts feeling full, will stop eating. States she went to the doctor last week and was 250. 270s in Feb 2022. Protonix once daily as needed.   Colonoscopy about a year ago in [place]. We need records.   Past Medical History:  Diagnosis Date  . Anxiety   . Chest pain   . Depression   . Diabetes mellitus without complication ([place])   . Diverticulitis   . Hypercholesterolemia   . Hypertension   . Lower back pain   . Rheumatoid arthritis ([place])   . Sleep apnea    on CPAP     Past Surgical History:  Procedure Laterality Date  . ABDOMINAL HYSTERECTOMY    . BIOPSY  09/23/2019   Procedure: BIOPSY;  Surgeon: Daneil Dolin, MD;  Location: [facility];  Service: Endoscopy;;  duodenum  . CHOLECYSTECTOMY    . COLONOSCOPY    . ESOPHAGOGASTRODUODENOSCOPY  12/21/2016   [facility]; normal esophagus, small hiatal hernia, nonbleeding erosions in the gastric antrum/B biopsied, normal pylorus, normal duodenum.  Pathology with chronic nonspecific gastritis, H. pylori negative.  . ESOPHAGOGASTRODUODENOSCOPY (EGD) WITH PROPOFOL N/A 09/23/2019   normal esophagus, normal stomach, abnormal duodenum of uncertain significance s/p biopsy. Path with small duodenal adenoma. Needs surveillance EGD now.   . ESOPHAGOGASTRODUODENOSCOPY (EGD) WITH PROPOFOL N/A 09/28/2020   small hiatal hernia, dilation of esophagus, otherwise normal, no evidence of residual adenoma.   Marland Kitchen MALONEY DILATION N/A  09/28/2020   Procedure: Venia Minks DILATION;  Surgeon: Daneil Dolin, MD;  Location: [facility];  Service: Endoscopy;  Laterality: N/A;  . OTHER SURGICAL HISTORY     HYSTERECTOMY 06/27/1997  . 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 Certolizumab  Pegol (CIMZIA [place]) Inject into the skin every 30 (thirty) days. 2 shots every 4 weeks  . FARXIGA 5 MG TABS tablet Take 5 mg by mouth daily.  Marland Kitchen ibuprofen (ADVIL) 200 MG tablet Take 400 mg by mouth every 8 (eight) hours as needed for moderate pain.  . meloxicam (MOBIC) 15 MG tablet Take 1 tablet by mouth daily.  . ondansetron (ZOFRAN) 4 MG tablet Take 1 tablet (4 mg total) by mouth every 8 (eight) hours as needed for nausea or vomiting.  . pantoprazole (PROTONIX) 40 MG tablet Take 1 tablet (40 mg total) by mouth 2 (two) times daily before a meal. (Patient taking differently: Take 40 mg by mouth daily as needed (acid reflux).)     Family History  Problem Relation Age of Onset  . Hypertension Mother   . Fibromyalgia Mother   . Anxiety disorder Mother   . Parkinson's disease Mother   . Hypertension Father   . Diabetes Father   . Heart disease Father   . Coronary artery disease Father   . Parkinson's disease Maternal Uncle   . Parkinson's disease Maternal Grandmother   . Alzheimer's disease Paternal Grandmother   . Sleep apnea Son   . Colon cancer Neg Hx     Social History   Socioeconomic History  . Marital status: Married    Spouse name: Not on file  . Number of children: 2  . Years of education: Not on file  . Highest education level: Not on file  Occupational History    Comment: [employer]  Tobacco Use  . Smoking status: Current Some Day Smoker    Types: Cigarettes    Last attempt to quit: 06/28/2003    Years since quitting: 17.4  . Smokeless tobacco: Never Used  . Tobacco comment: vapes  Vaping Use  . Vaping Use: Every day  . Substances: Nicotine, CBD, Flavoring  Substance and Sexual Activity  . Alcohol use: Yes    Comment: socially; Friday/Saturday beer 6-7 beers   . Drug use: Not Currently  . Sexual activity: Yes  Other Topics Concern  . Not on file  Social History Narrative  . Not on file   Social Determinants of Health   Financial Resource Strain: Not on  file  Food Insecurity: Not on file  Transportation Needs: No Transportation Needs  . Lack of Transportation (Medical): No  . Lack of Transportation (Non-Medical): No  Physical Activity: Inactive  . Days of Exercise per Week: 0 days  . Minutes of Exercise per Session: 0 min  Stress: Not on file  Social Connections: Not on file       Review of Systems: Gen: Denies fever, chills, anorexia. Denies fatigue, weakness, weight loss.  CV: Denies chest pain, palpitations, syncope, peripheral edema, and claudication. Resp: Denies dyspnea at rest, cough, wheezing, coughing up blood, and pleurisy. GI: see HPI Derm: Denies rash, itching, dry skin Psych: Denies depression, anxiety, memory loss, confusion. No homicidal or suicidal ideation.  Heme: Denies bruising, bleeding, and enlarged lymph nodes.  Observations/Objective: No distress. Unable to perform physical exam due to telephone encounter. No video available.   Assessment and  Plan: Pleasant 55 year old female presenting today with a history of abdominal pain, bloating, nausea, elevated transaminases, duodenal adenoma and surveillance EGD recently completed without evidence for residual adenoma, returning in follow-up.  RUQ pain: improved from last visit. Now just intermittently. No CBD dilation, MRCP unrevealing, no significant bumps in transaminases although remaining elevated and not improving.   Elevated transaminases: thorough serological eval thus far. As no improvement, we discussed liver biopsy, which she would like to pursue.  Early satiety: chronic, with associated weight loss. EGD recently on file. CT 2021 on file. MRCP recently on file. Colonoscopy last year in [place]. Unclear etiology at this point. No nausea. GES normal. Will follow closely.   1. Arranging liver biopsy 2. Keep follow-up with Hematology for elevated ferritin, iron sats 3. Obtain colonoscopy reports 4. 3 month follow-up  Follow Up Instructions:    I  discussed the assessment and treatment plan with the patient. The patient was provided an opportunity to ask questions and all were answered. The patient agreed with the plan and demonstrated an understanding of the instructions.   The patient was advised to call back or seek an in-person evaluation if the symptoms worsen or if the condition fails to improve as anticipated.  I provided 10 minutes of non face-to-face time during this telephone encounter.  Annitta Needs, PhD, ANP-BC [facility]

## 2020-11-26 NOTE — Addendum Note (Signed)
Addended by: Cheron Every on: 11/26/2020 01:36 PM   Modules accepted: Orders

## 2020-11-26 NOTE — Telephone Encounter (Signed)
Pt consented to a virtual visit. 

## 2020-11-26 NOTE — Telephone Encounter (Signed)
[facility] clinical pool: please arrange liver biopsy due to persistently elevated transaminases.   Teresa Mccoy: needs 3 month follow-up

## 2020-11-26 NOTE — Telephone Encounter (Signed)
Teresa Mccoy, you are scheduled for a virtual visit with your provider today.  Just as we do with appointments in the office, we must obtain your consent to participate.  Your consent will be active for this visit and any virtual visit you may have with one of our providers in the next 365 days.  If you have a MyChart account, I can also send a copy of this consent to you electronically.  All virtual visits are billed to your insurance company just like a traditional visit in the office.  As this is a virtual visit, video technology does not allow for your provider to perform a traditional examination.  This may limit your provider's ability to fully assess your condition.  If your provider identifies any concerns that need to be evaluated in person or the need to arrange testing such as labs, EKG, etc, we will make arrangements to do so.  Although advances in technology are sophisticated, we cannot ensure that it will always work on either your end or our end.  If the connection with a video visit is poor, we may have to switch to a telephone visit.  With either a video or telephone visit, we are not always able to ensure that we have a secure connection.   I need to obtain your verbal consent now.   Are you willing to proceed with your visit today?

## 2020-11-26 NOTE — Patient Instructions (Signed)
We are arranging a liver biopsy in near future.  We will see you in 3 months!  I enjoyed seeing you again today! As you know, I value our relationship and want to provide genuine, compassionate, and quality care. I welcome your feedback. If you receive a survey regarding your visit,  I greatly appreciate you taking time to fill this out. See you next time!  Annitta Needs, PhD, ANP-BC [facility]

## 2020-11-30 NOTE — Telephone Encounter (Signed)
ON RECALL TO FOLLOW UP IN 3 MONTHS WITH [facility]

## 2020-11-30 NOTE — Progress Notes (Signed)
Cc'ed to pcp °

## 2020-12-01 ENCOUNTER — Telehealth: Payer: Self-pay

## 2020-12-01 NOTE — Telephone Encounter (Signed)
Kaylee at pre-service center called office. Liver biopsy scheduled for 12/03/20 needs PA from [facility]. Donnetta Simpers spoke to [place] at [phone]. Kaylee's phone number is [phone] ext [phone].

## 2020-12-01 NOTE — Telephone Encounter (Signed)
Per appt desk, Teresa Mccoy at central scheduling scheduled liver biopsy.   Called central scheduling, spoke to [place]. Teresa Mccoy was on another line, he sent message to her to call office.

## 2020-12-02 ENCOUNTER — Other Ambulatory Visit: Payer: Self-pay | Admitting: Student

## 2020-12-02 NOTE — H&P (Signed)
Chief Complaint: Elevated Liver enzymes  Referring Physician(s): Roseanne Kaufman, NP  Supervising Physician: Aletta Edouard  Patient Status: [facility] - Out-pt  History of Present Illness: Teresa Mccoy is a 55 y.o. female with medical issues including diabetes, diverticulitis, hypertension, and anxiety.  She is currently being worked up for elevated LFTs.  Labs on 08/26/20 = AST 107 ALT 111 Alk Phos 143  LFTs have been persistently elevated for over a year.  US done 08/27/20 showed= The echogenicity of the liver is increased. This is a nonspecific finding but is most commonly seen with fatty infiltration of the liver. There are no obvious focal liver lesions.  She is here today for random liver biopsy.  She is NPO. No nausea/vomiting. No Fever/chills. ROS negative.   Past Medical History:  Diagnosis Date   Anxiety    Chest pain    Depression    Diabetes mellitus without complication ([place])    Diverticulitis    Hypercholesterolemia    Hypertension    Lower back pain    Rheumatoid arthritis ([place])    Sleep apnea    on CPAP    Past Surgical History:  Procedure Laterality Date   ABDOMINAL HYSTERECTOMY     BIOPSY  09/23/2019   Procedure: BIOPSY;  Surgeon: Daneil Dolin, MD;  Location: [facility];  Service: Endoscopy;;  duodenum   CHOLECYSTECTOMY     COLONOSCOPY     ESOPHAGOGASTRODUODENOSCOPY  12/21/2016   [facility]; normal esophagus, small hiatal hernia, nonbleeding erosions in the gastric antrum/B biopsied, normal pylorus, normal duodenum.  Pathology with chronic nonspecific gastritis, H. pylori negative.   ESOPHAGOGASTRODUODENOSCOPY (EGD) WITH PROPOFOL N/A 09/23/2019   normal esophagus, normal stomach, abnormal duodenum of uncertain significance s/p biopsy. Path with small duodenal adenoma. Needs surveillance EGD now.    ESOPHAGOGASTRODUODENOSCOPY (EGD) WITH PROPOFOL N/A 09/28/2020   small hiatal hernia, dilation of esophagus, otherwise normal, no  evidence of residual adenoma.    MALONEY DILATION N/A 09/28/2020   Procedure: Venia Minks DILATION;  Surgeon: Daneil Dolin, MD;  Location: [facility];  Service: Endoscopy;  Laterality: N/A;   OTHER SURGICAL HISTORY     HYSTERECTOMY 06/27/1997   REPLACEMENT TOTAL KNEE  1/1/192013   LEFT KNEE JOINT     Allergies: Patient has no known allergies.  Medications: Prior to Admission medications   Medication Sig Start Date End Date Taking? Authorizing Provider  acetaminophen (TYLENOL) 500 MG tablet Take 1,500-2,000 mg by mouth 3 (three) times daily as needed for moderate pain or headache.   Yes [provider]  Certolizumab Pegol (CIMZIA [place]) Inject 1 Dose into the skin every 28 (twenty-eight) days. 2 shots every 4 weeks   Yes [provider]  FARXIGA 5 MG TABS tablet Take 5 mg by mouth daily. 08/06/20  Yes [provider]  ibuprofen (ADVIL) 200 MG tablet Take 800 mg by mouth 2 (two) times daily as needed for moderate pain.   Yes [provider]  meloxicam (MOBIC) 15 MG tablet Take 15 mg by mouth daily. 11/09/20  Yes [provider]  pantoprazole (PROTONIX) 40 MG tablet Take 1 tablet (40 mg total) by mouth 2 (two) times daily before a meal. Patient taking differently: Take 40 mg by mouth daily as needed (acid reflux). 08/28/20 08/28/21 Yes Annitta Needs, NP  ondansetron (ZOFRAN) 4 MG tablet Take 1 tablet (4 mg total) by mouth every 8 (eight) hours as needed for nausea or vomiting. Patient not taking: Reported on  11/27/2020 08/26/20   Annitta Needs, NP     Family History  Problem Relation Age of Onset   Hypertension Mother    Fibromyalgia Mother    Anxiety disorder Mother    Parkinson's disease Mother    Hypertension Father    Diabetes Father    Heart disease Father    Coronary artery disease Father    Parkinson's disease Maternal Uncle    Parkinson's disease Maternal Grandmother    Alzheimer's disease Paternal Grandmother    Sleep apnea Son    Colon  cancer Neg Hx     Social History   Socioeconomic History   Marital status: Married    Spouse name: Not on file   Number of children: 2   Years of education: Not on file   Highest education level: Not on file  Occupational History    Comment: [employer]  Tobacco Use   Smoking status: Former    Pack years: 0.00    Types: Cigarettes    Quit date: 06/28/2003    Years since quitting: 17.4   Smokeless tobacco: Never   Tobacco comments:    vapes  Vaping Use   Vaping Use: Every day   Substances: Nicotine, CBD, Flavoring  Substance and Sexual Activity   Alcohol use: Yes    Comment: socially; Friday/Saturday beer 6-7 beers    Drug use: Not Currently   Sexual activity: Yes  Other Topics Concern   Not on file  Social History Narrative   Not on file   Social Determinants of Health   Financial Resource Strain: Not on file  Food Insecurity: Not on file  Transportation Needs: No Transportation Needs   Lack of Transportation (Medical): No   Lack of Transportation (Non-Medical): No  Physical Activity: Inactive   Days of Exercise per Week: 0 days   Minutes of Exercise per Session: 0 min  Stress: Not on file  Social Connections: Not on file     Review of Systems: A 12 point ROS discussed and pertinent positives are indicated in the HPI above.  All other systems are negative.  Review of Systems  Constitutional: Negative.    Vital Signs: BP 140/79   Pulse 70   Temp 97.7 F (36.5 C) (Oral)   Resp 16   Ht _0  (1.651 m)   Wt 114.8 kg   SpO2 97%   BMI 42.10 kg/m   Physical Exam Vitals reviewed.  Constitutional:      Appearance: Normal appearance.  HENT:     Head: Normocephalic and atraumatic.  Eyes:     Extraocular Movements: Extraocular movements intact.  Cardiovascular:     Rate and Rhythm: Normal rate and regular rhythm.  Pulmonary:     Effort: Pulmonary effort is normal. No respiratory distress.     Breath sounds: Normal breath sounds.  Abdominal:      General: There is no distension.     Palpations: Abdomen is soft.     Tenderness: There is no abdominal tenderness.  Musculoskeletal:        General: Normal range of motion.  Skin:    General: Skin is warm and dry.  Neurological:     General: No focal deficit present.     Mental Status: She is alert and oriented to person, place, and time.  Psychiatric:        Mood and Affect: Mood normal.        Behavior: Behavior normal.  Thought Content: Thought content normal.        Judgment: Judgment normal.    Imaging: No results found.  Labs:  CBC: Recent Labs    08/26/20 1148  WBC 5.8  HGB 13.9  HCT 41.6  PLT 142*    COAGS: Recent Labs    08/20/20 1139  INR 1.0    BMP: Recent Labs    09/24/20 1509  NA 138  K 4.4  CL 103  CO2 26  GLUCOSE 98  BUN 14  CALCIUM 9.9  CREATININE 0.63  GFRNONAA >60    LIVER FUNCTION TESTS: Recent Labs    08/26/20 1148  BILITOT 0.4  AST 107*  ALT 111*  ALKPHOS 143*  PROT 7.3  ALBUMIN 4.2    TUMOR MARKERS: No results for input(s): AFPTM, CEA, CA199, CHROMGRNA in the last 8760 hours.  Assessment and Plan:  Elevated LFTs  Will proceed with image guided random liver biopsy today by Dr. Kathlene Cote.  Risks and benefits of random liver biopsy was discussed with the patient and/or patient's family including, but not limited to bleeding, infection, damage to adjacent structures or low yield requiring additional tests.  All of the questions were answered and there is agreement to proceed.  Consent signed and in chart.  Thank you for this interesting consult.  I greatly enjoyed [place] and look forward to participating in their care.  A copy of this report was sent to the requesting provider on this date.  Electronically Signed: Murrell Redden, PA-C   12/03/2020, 6:56 AM      I spent a total of  30 Minutes   in face to face in clinical consultation, greater than 50% of which was counseling/coordinating  care for random liver biopsy.

## 2020-12-02 NOTE — Telephone Encounter (Signed)
Wells Guiles at [place], US liver biopsy approved. PA# [id number], valid date of service 12/03/20.  Tried to call Donnetta Simpers at pre-service center, LMOVM to inform her of PA.

## 2020-12-02 NOTE — Telephone Encounter (Signed)
Spoke to [place] at central scheduling. Central scheduling doesn't do PA if needed for liver biopsy.  [employer], spoke to [place]. PA started for liver biopsy. She will call office back.

## 2020-12-03 ENCOUNTER — Encounter (HOSPITAL_COMMUNITY): Payer: Self-pay

## 2020-12-03 ENCOUNTER — Ambulatory Visit (HOSPITAL_COMMUNITY)
Admission: RE | Admit: 2020-12-03 | Discharge: 2020-12-03 | Disposition: A | Discharge status: Home / Self Care | Payer: PRIVATE HEALTH INSURANCE | Source: Ambulatory Visit | Attending: Gastroenterology | Admitting: Gastroenterology

## 2020-12-03 DIAGNOSIS — Z833 Family history of diabetes mellitus: Secondary | ICD-10-CM | POA: Insufficient documentation

## 2020-12-03 DIAGNOSIS — E7800 Pure hypercholesterolemia, unspecified: Secondary | ICD-10-CM | POA: Diagnosis not present

## 2020-12-03 DIAGNOSIS — Z7984 Long term (current) use of oral hypoglycemic drugs: Secondary | ICD-10-CM | POA: Diagnosis not present

## 2020-12-03 DIAGNOSIS — K7460 Unspecified cirrhosis of liver: Secondary | ICD-10-CM | POA: Diagnosis not present

## 2020-12-03 DIAGNOSIS — E119 Type 2 diabetes mellitus without complications: Secondary | ICD-10-CM | POA: Insufficient documentation

## 2020-12-03 DIAGNOSIS — Z79899 Other long term (current) drug therapy: Secondary | ICD-10-CM | POA: Insufficient documentation

## 2020-12-03 DIAGNOSIS — R7989 Other specified abnormal findings of blood chemistry: Secondary | ICD-10-CM

## 2020-12-03 DIAGNOSIS — K7581 Nonalcoholic steatohepatitis (NASH): Secondary | ICD-10-CM | POA: Diagnosis not present

## 2020-12-03 DIAGNOSIS — Z87891 Personal history of nicotine dependence: Secondary | ICD-10-CM | POA: Insufficient documentation

## 2020-12-03 DIAGNOSIS — I10 Essential (primary) hypertension: Secondary | ICD-10-CM | POA: Insufficient documentation

## 2020-12-03 LAB — CBC
HCT: 43 % (ref 36.0–46.0)
Hemoglobin: 14.3 g/dL (ref 12.0–15.0)
MCH: 31.4 pg (ref 26.0–34.0)
MCHC: 33.3 g/dL (ref 30.0–36.0)
MCV: 94.5 fL (ref 80.0–100.0)
Platelets: 130 10*3/uL — ABNORMAL LOW (ref 150–400)
RBC: 4.55 MIL/uL (ref 3.87–5.11)
RDW: 12.9 % (ref 11.5–15.5)
WBC: 5.6 10*3/uL (ref 4.0–10.5)
nRBC: 0 % (ref 0.0–0.2)

## 2020-12-03 LAB — GLUCOSE, CAPILLARY
Glucose-Capillary: 128 mg/dL — ABNORMAL HIGH (ref 70–99)
Glucose-Capillary: 107 mg/dL — ABNORMAL HIGH (ref 70–99)

## 2020-12-03 LAB — PROTIME-INR
INR: 1 (ref 0.8–1.2)
Prothrombin Time: 13.3 seconds (ref 11.4–15.2)

## 2020-12-03 MED ORDER — SODIUM CHLORIDE 0.9 % IV SOLN: INTRAVENOUS | Status: DC

## 2020-12-03 MED ORDER — ONDANSETRON HCL 4 MG/2ML IJ SOLN
INTRAMUSCULAR | Status: AC
  Filled 2020-12-03: qty 2

## 2020-12-03 MED ORDER — HYDROCODONE-ACETAMINOPHEN 5-325 MG PO TABS: 1.0000 | ORAL_TABLET | ORAL | Status: DC | PRN

## 2020-12-03 MED ORDER — FENTANYL CITRATE (PF) 100 MCG/2ML IJ SOLN
INTRAMUSCULAR | Status: AC | PRN
  Administered 2020-12-03 (×2): 50 ug via INTRAVENOUS

## 2020-12-03 MED ORDER — ONDANSETRON HCL 4 MG/2ML IJ SOLN
4.0000 mg | INTRAMUSCULAR | Status: DC | PRN
  Filled 2020-12-03 (×2): qty 2

## 2020-12-03 MED ORDER — MIDAZOLAM HCL 2 MG/2ML IJ SOLN
INTRAMUSCULAR | Status: AC | PRN
  Administered 2020-12-03 (×2): 1 mg via INTRAVENOUS

## 2020-12-03 MED ORDER — GELATIN ABSORBABLE 12-7 MM EX MISC
CUTANEOUS | Status: AC | PRN
  Administered 2020-12-03: 1 via TOPICAL

## 2020-12-03 MED ORDER — LIDOCAINE HCL (PF) 1 % IJ SOLN
INTRAMUSCULAR | Status: AC | PRN
  Administered 2020-12-03: 10 mL

## 2020-12-03 MED ORDER — HYDROMORPHONE HCL 1 MG/ML IJ SOLN
INTRAMUSCULAR | Status: AC
  Filled 2020-12-03: qty 1

## 2020-12-03 MED ORDER — ONDANSETRON HCL 4 MG/2ML IJ SOLN
4.0000 mg | Freq: Once | INTRAMUSCULAR | Status: AC
  Administered 2020-12-03: 4 mg via INTRAVENOUS
  Filled 2020-12-03: qty 2

## 2020-12-03 MED ORDER — LIDOCAINE HCL (PF) 1 % IJ SOLN
INTRAMUSCULAR | Status: AC
  Filled 2020-12-03: qty 30

## 2020-12-03 MED ORDER — GELATIN ABSORBABLE 12-7 MM EX MISC
CUTANEOUS | Status: AC
  Filled 2020-12-03: qty 1

## 2020-12-03 MED ORDER — HYDROMORPHONE HCL 1 MG/ML IJ SOLN
INTRAMUSCULAR | Status: AC | PRN
  Administered 2020-12-03: 1 mg via INTRAVENOUS

## 2020-12-03 MED ORDER — MIDAZOLAM HCL 2 MG/2ML IJ SOLN
INTRAMUSCULAR | Status: AC
  Filled 2020-12-03: qty 2

## 2020-12-03 MED ORDER — FENTANYL CITRATE (PF) 100 MCG/2ML IJ SOLN
INTRAMUSCULAR | Status: AC
  Filled 2020-12-03: qty 2

## 2020-12-03 MED ORDER — HYDROMORPHONE HCL 1 MG/ML IJ SOLN: 1.0000 mg | INTRAMUSCULAR | Status: DC | PRN

## 2020-12-03 NOTE — Progress Notes (Signed)
NO SHOW - APPT CANCELLED

## 2020-12-03 NOTE — Procedures (Signed)
Interventional Radiology Procedure Note  Procedure: US Guided Biopsy of Liver  Complications: None  Estimated Blood Loss: < 10 mL  Findings: 18 G core biopsy of liver performed under US guidance.  Three core samples obtained and sent to Pathology.  Daune Colgate T. Lorenza Winkleman, M.D Pager:  [phone]    

## 2020-12-03 NOTE — Progress Notes (Signed)
Notified Teresa Mccoy of patient having nausea. Zofran given as ordered.

## 2020-12-03 NOTE — Sedation Documentation (Signed)
Called Dr. Kathlene Cote as patient has pain and increased blood pressure post procedure. Verbal order given for 1mg  hydromorphone IV. Read back and verified.

## 2020-12-07 LAB — SURGICAL PATHOLOGY

## 2020-12-09 ENCOUNTER — Telehealth: Payer: Self-pay | Admitting: Internal Medicine

## 2020-12-09 ENCOUNTER — Telehealth: Payer: Self-pay

## 2020-12-09 NOTE — Telephone Encounter (Signed)
Pt called asking to speak with AB. She has questions about her results. [phone]

## 2020-12-09 NOTE — Telephone Encounter (Signed)
Message has been put back to Teresa Mccoy already today. Waiting on reply.

## 2020-12-09 NOTE — Telephone Encounter (Signed)
The pt phoned needing to speak to you regarding her pathology report which has not been reported to me yet. Please advise and I will call the pt.

## 2020-12-10 ENCOUNTER — Encounter: Payer: Self-pay | Admitting: Internal Medicine

## 2021-02-02 ENCOUNTER — Emergency Department (HOSPITAL_COMMUNITY): Payer: PRIVATE HEALTH INSURANCE

## 2021-02-02 ENCOUNTER — Other Ambulatory Visit: Payer: Self-pay

## 2021-02-02 ENCOUNTER — Emergency Department (HOSPITAL_COMMUNITY)
Admission: EM | Admit: 2021-02-02 | Discharge: 2021-02-03 | Disposition: A | Discharge status: Home / Self Care | Payer: PRIVATE HEALTH INSURANCE | Attending: Emergency Medicine | Admitting: Emergency Medicine

## 2021-02-02 DIAGNOSIS — E119 Type 2 diabetes mellitus without complications: Secondary | ICD-10-CM | POA: Insufficient documentation

## 2021-02-02 DIAGNOSIS — E7220 Disorder of urea cycle metabolism, unspecified: Secondary | ICD-10-CM | POA: Diagnosis not present

## 2021-02-02 DIAGNOSIS — R110 Nausea: Secondary | ICD-10-CM | POA: Insufficient documentation

## 2021-02-02 DIAGNOSIS — Z96652 Presence of left artificial knee joint: Secondary | ICD-10-CM | POA: Insufficient documentation

## 2021-02-02 DIAGNOSIS — Z87891 Personal history of nicotine dependence: Secondary | ICD-10-CM | POA: Insufficient documentation

## 2021-02-02 DIAGNOSIS — R0781 Pleurodynia: Secondary | ICD-10-CM | POA: Diagnosis not present

## 2021-02-02 DIAGNOSIS — I10 Essential (primary) hypertension: Secondary | ICD-10-CM | POA: Diagnosis not present

## 2021-02-02 LAB — CBC WITH DIFFERENTIAL/PLATELET
Abs Immature Granulocytes: 0 10*3/uL (ref 0.00–0.07)
Basophils Absolute: 0.1 10*3/uL (ref 0.0–0.1)
Basophils Relative: 1 %
Eosinophils Absolute: 0.2 10*3/uL (ref 0.0–0.5)
Eosinophils Relative: 3 %
HCT: 42.9 % (ref 36.0–46.0)
Hemoglobin: 14.1 g/dL (ref 12.0–15.0)
Lymphocytes Relative: 37 %
Lymphs Abs: 2.1 10*3/uL (ref 0.7–4.0)
MCH: 31.3 pg (ref 26.0–34.0)
MCHC: 32.9 g/dL (ref 30.0–36.0)
MCV: 95.1 fL (ref 80.0–100.0)
Monocytes Absolute: 0.5 10*3/uL (ref 0.1–1.0)
Monocytes Relative: 8 %
Neutro Abs: 3 10*3/uL (ref 1.7–7.7)
Neutrophils Relative %: 51 %
Platelets: 138 10*3/uL — ABNORMAL LOW (ref 150–400)
RBC: 4.51 MIL/uL (ref 3.87–5.11)
RDW: 13.1 % (ref 11.5–15.5)
WBC: 5.8 10*3/uL (ref 4.0–10.5)
nRBC: 0 % (ref 0.0–0.2)
nRBC: 0 /100 WBC

## 2021-02-02 LAB — COMPREHENSIVE METABOLIC PANEL
ALT: 82 U/L — ABNORMAL HIGH (ref 0–44)
AST: 79 U/L — ABNORMAL HIGH (ref 15–41)
Albumin: 4 g/dL (ref 3.5–5.0)
Alkaline Phosphatase: 97 U/L (ref 38–126)
Anion gap: 7 (ref 5–15)
BUN: 10 mg/dL (ref 6–20)
CO2: 27 mmol/L (ref 22–32)
Calcium: 9.6 mg/dL (ref 8.9–10.3)
Chloride: 103 mmol/L (ref 98–111)
Creatinine, Ser: 0.69 mg/dL (ref 0.44–1.00)
GFR, Estimated: >60 mL/min (ref >60)
Glucose, Bld: 102 mg/dL — ABNORMAL HIGH (ref 70–99)
Potassium: 4.2 mmol/L (ref 3.5–5.1)
Sodium: 137 mmol/L (ref 135–145)
Total Bilirubin: 1 mg/dL (ref 0.3–1.2)
Total Protein: 7.7 g/dL (ref 6.5–8.1)

## 2021-02-02 LAB — URINALYSIS, ROUTINE W REFLEX MICROSCOPIC
Bilirubin Urine: NEGATIVE
Glucose, UA: NEGATIVE mg/dL
Hgb urine dipstick: NEGATIVE
Ketones, ur: NEGATIVE mg/dL
Leukocytes,Ua: NEGATIVE
Nitrite: NEGATIVE
Protein, ur: NEGATIVE mg/dL
Specific Gravity, Urine: 1.016 (ref 1.005–1.030)
pH: 5 (ref 5.0–8.0)

## 2021-02-02 LAB — LIPASE, BLOOD: Lipase: 31 U/L (ref 11–51)

## 2021-02-02 NOTE — ED Provider Notes (Signed)
Emergency Medicine Provider Triage Evaluation Note  Teresa Mccoy , a 55 y.o. female  was evaluated in triage.  Pt complains of right upper quadrant pain that is been ongoing for the past 2 weeks, exacerbated about 2 days ago.  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  Has had no follow-up recently.No MAT.   Review of Systems  Positive: ABDOMINAL PAIN, NAUSEA Negative: Diarrhea, vomiting,  Physical Exam  BP 135/65   Pulse 72   Resp (!) 21   Ht '5\' 5"'$  (1.651 m)   Wt 114.8 kg   SpO2 100%   BMI 42.10 kg/m  Gen:   Awake, no distress   Resp:  Normal effort  MSK:   Moves extremities without difficulty  Other:  TPP along the RUQ, NO CVA .  Medical Decision Making  Medically screening exam initiated at 2:58 PM.  Appropriate orders placed.  Dede [facility] was informed that the remainder of the evaluation will be completed by another provider, this initial triage assessment does not replace that evaluation, and the importance of remaining in the ED until their evaluation is complete.    Janeece Fitting, PA-C 02/02/21 [place], [place], DO 02/02/21 1753

## 2021-02-02 NOTE — ED Triage Notes (Signed)
Pt reports ongoing R sided abdominal pain radiating up towards her neck. States she has hx of liver cirrhosis and thinks this is related. Endorses nausea without vomiting. States that she has had poor PO intake due to pain.

## 2021-02-03 ENCOUNTER — Emergency Department (HOSPITAL_COMMUNITY): Payer: PRIVATE HEALTH INSURANCE

## 2021-02-03 DIAGNOSIS — R0781 Pleurodynia: Secondary | ICD-10-CM | POA: Diagnosis not present

## 2021-02-03 LAB — TROPONIN I (HIGH SENSITIVITY): Troponin I (High Sensitivity): 3 ng/L (ref <18)

## 2021-02-03 LAB — PROTIME-INR
INR: 1.1 (ref 0.8–1.2)
Prothrombin Time: 14 seconds (ref 11.4–15.2)

## 2021-02-03 LAB — AMMONIA: Ammonia: 93 umol/L — ABNORMAL HIGH (ref 9–35)

## 2021-02-03 LAB — D-DIMER, QUANTITATIVE: D-Dimer, Quant: 0.33 ug/mL-FEU (ref 0.00–0.50)

## 2021-02-03 MED ORDER — LACTULOSE 10 GM/15ML PO SOLN
30.0000 g | Freq: Once | ORAL | Status: AC
  Administered 2021-02-03: 30 g via ORAL
  Filled 2021-02-03: qty 60

## 2021-02-03 MED ORDER — LACTULOSE 10 GM/15ML PO SOLN: 30.0000 g | Freq: Three times a day (TID) | ORAL | 6 refills | Status: AC

## 2021-02-03 MED ORDER — LACTULOSE 10 GM/15ML PO SOLN: 30.0000 g | Freq: Three times a day (TID) | ORAL | 0 refills | Status: DC

## 2021-02-03 NOTE — ED Provider Notes (Signed)
[facility] EMERGENCY DEPARTMENT Provider Note   CSN: [id number] Arrival date & time: 02/02/21  1406     History Chief Complaint  Patient presents with   Abdominal Pain    Teresa Mccoy is a 55 y.o. female.  Patient presents to the emergency department with complaints of right-sided pain.  Patient reports that she has been recently been worked up for liver problems, was diagnosed with liver cirrhosis.  For the last several days she has been having pain diffusely on the right side.  For the last 1 to 2 days it has become more constant and severe.  Seems to be centered under the ribs on the right side and goes up into the upper chest and shoulder area.  She has not identified anything that makes it better or worse.  She does have some nausea after eating but no exacerbation of the pain with meals.  Patient denies any shortness of breath.  She has not had any cough or congestion.  Pain is not worse with breathing.  No rash along the ribs.      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   Elevated ferritin level 07/31/2019   Elevated LFTs 07/16/2019   Early satiety 07/15/2019   Heartburn 07/15/2019   LLQ pain 07/15/2019   Nausea without vomiting 07/15/2019    Past Surgical History:  Procedure Laterality Date   ABDOMINAL HYSTERECTOMY     BIOPSY  09/23/2019   Procedure: BIOPSY;  Surgeon: Daneil Dolin, MD;  Location: [facility];  Service: Endoscopy;;  duodenum   CHOLECYSTECTOMY     COLONOSCOPY     ESOPHAGOGASTRODUODENOSCOPY  12/21/2016   [facility]; normal esophagus, small hiatal hernia,  nonbleeding erosions in the gastric antrum/B biopsied, normal pylorus, normal duodenum.  Pathology with chronic nonspecific gastritis, H. pylori negative.   ESOPHAGOGASTRODUODENOSCOPY (EGD) WITH PROPOFOL N/A 09/23/2019   normal esophagus, normal stomach, abnormal duodenum of uncertain significance s/p biopsy. Path with small duodenal adenoma. Needs surveillance EGD now.    ESOPHAGOGASTRODUODENOSCOPY (EGD) WITH PROPOFOL N/A 09/28/2020   small hiatal hernia, dilation of esophagus, otherwise normal, no evidence of residual adenoma.    MALONEY DILATION N/A 09/28/2020   Procedure: Venia Minks DILATION;  Surgeon: Daneil Dolin, MD;  Location: [facility];  Service: Endoscopy;  Laterality: N/A;   OTHER SURGICAL HISTORY     HYSTERECTOMY 06/27/1997   REPLACEMENT TOTAL KNEE  1/1/192013   LEFT KNEE JOINT      OB History   No obstetric history on file.     Family History  Problem Relation Age of Onset   Hypertension Mother    Fibromyalgia Mother    Anxiety disorder Mother    Parkinson's disease Mother    Hypertension Father    Diabetes Father    Heart disease Father    Coronary artery disease Father    Parkinson's disease Maternal Uncle    Parkinson's disease Maternal Grandmother    Alzheimer's disease Paternal Grandmother    Sleep apnea Son    Colon cancer  Neg Hx     Social History   Tobacco Use   Smoking status: Former    Types: Cigarettes    Quit date: 06/28/2003    Years since quitting: 17.6   Smokeless tobacco: Never   Tobacco comments:    vapes  Vaping Use   Vaping Use: Every day   Substances: Nicotine, CBD, Flavoring  Substance Use Topics   Alcohol use: Yes    Comment: socially; Friday/Saturday beer 6-7 beers    Drug use: Not Currently    Home Medications Prior to Admission medications   Medication Sig Start Date End Date Taking? Authorizing Provider  Certolizumab Pegol (CIMZIA [place]) Inject 1 Dose into the skin every 28 (twenty-eight) days. 2 shots every 4 weeks   Yes  [provider]  FARXIGA 5 MG TABS tablet Take 5 mg by mouth daily. 08/06/20  Yes [provider]  MILK THISTLE PO Take 1 capsule by mouth every Monday, Wednesday, and Friday.   Yes [provider]  Polyethyl Glycol-Propyl Glycol (SYSTANE OP) Place 1 drop into both eyes every 4 (four) hours.   Yes [provider]  lactulose (CHRONULAC) 10 GM/15ML solution Take 45 mLs (30 g total) by mouth 3 (three) times daily. 02/03/21   Orpah Greek, MD  ondansetron (ZOFRAN) 4 MG tablet Take 1 tablet (4 mg total) by mouth every 8 (eight) hours as needed for nausea or vomiting. Patient not taking: No sig reported 08/26/20   Annitta Needs, NP  pantoprazole (PROTONIX) 40 MG tablet Take 1 tablet (40 mg total) by mouth 2 (two) times daily before a meal. Patient not taking: Reported on 02/03/2021 08/28/20 08/28/21  Annitta Needs, NP    Allergies    Patient has no known allergies.  Review of Systems   Review of Systems  Cardiovascular:  Positive for chest pain.  Gastrointestinal:  Positive for abdominal pain.  All other systems reviewed and are negative.  Physical Exam Updated Vital Signs BP 112/60   Pulse 63   Temp 98.6 F (37 C) (Oral)   Resp 19   Ht '5\' 5"'$  (1.651 m)   Wt 114.8 kg   SpO2 99%   BMI 42.10 kg/m   Physical Exam Vitals and nursing note reviewed.  Constitutional:      General: She is not in acute distress.    Appearance: Normal appearance. She is well-developed.  HENT:     Head: Normocephalic and atraumatic.     Right Ear: Hearing normal.     Left Ear: Hearing normal.     Nose: Nose normal.  Eyes:     Conjunctiva/sclera: Conjunctivae normal.     Pupils: Pupils are equal, round, and reactive to light.  Cardiovascular:     Rate and Rhythm: Regular rhythm.     Heart sounds: S1 normal and S2 normal. No murmur heard.   No friction rub. No gallop.  Pulmonary:     Effort: Pulmonary effort is normal. No respiratory distress.     Breath sounds:  Normal breath sounds.  Chest:     Chest wall: No tenderness.  Abdominal:     General: Bowel sounds are normal.     Palpations: Abdomen is soft.     Tenderness: There is no abdominal tenderness. There is no guarding or rebound. Negative signs include Murphy's sign and McBurney's sign.     Hernia: No hernia is present.  Musculoskeletal:        General: Normal range of motion.  Cervical back: Normal range of motion and neck supple.  Skin:    General: Skin is warm and dry.     Findings: No rash.  Neurological:     Mental Status: She is alert and oriented to person, place, and time.     GCS: GCS eye subscore is 4. GCS verbal subscore is 5. GCS motor subscore is 6.     Cranial Nerves: No cranial nerve deficit.     Sensory: No sensory deficit.     Coordination: Coordination normal.  Psychiatric:        Speech: Speech normal.        Behavior: Behavior normal.        Thought Content: Thought content normal.    ED Results / Procedures / Treatments   Labs (all labs ordered are listed, but only abnormal results are displayed) Labs Reviewed  CBC WITH DIFFERENTIAL/PLATELET - Abnormal; Notable for the following components:      Result Value   Platelets 138 (*)    All other components within normal limits  COMPREHENSIVE METABOLIC PANEL - Abnormal; Notable for the following components:   Glucose, Bld 102 (*)    AST 79 (*)    ALT 82 (*)    All other components within normal limits  URINALYSIS, ROUTINE W REFLEX MICROSCOPIC - Abnormal; Notable for the following components:   APPearance HAZY (*)    All other components within normal limits  AMMONIA - Abnormal; Notable for the following components:   Ammonia 93 (*)    All other components within normal limits  LIPASE, BLOOD  PROTIME-INR  D-DIMER, QUANTITATIVE  TROPONIN I (HIGH SENSITIVITY)    EKG EKG Interpretation  Date/Time:  Wednesday February 03 2021 01:25:02 EDT Ventricular Rate:  64 PR Interval:  136 QRS Duration: 103 QT  Interval:  430 QTC Calculation: 444 R Axis:   25 Text Interpretation: Sinus rhythm Normal ECG Confirmed by Orpah Greek [phone]) on 02/03/2021 1:27:03 AM  Radiology DG Chest 2 View  Result Date: 02/03/2021 CLINICAL DATA:  Right chest pain EXAM: CHEST - 2 VIEW COMPARISON:  Radiograph 01/20/2020, CT 08/13/2018 FINDINGS: No consolidation, features of edema, pneumothorax, or effusion. Pulmonary vascularity is normally distributed. The cardiomediastinal contours are unremarkable. No acute osseous or soft tissue abnormality. Cholecystectomy clips in the right upper quadrant. IMPRESSION: No acute cardiopulmonary or visible chest wall abnormality. Electronically Signed   By: Lovena Le M.D.   On: 02/03/2021 01:11   US Abdomen Limited  Result Date: 02/02/2021 CLINICAL DATA:  Right upper quadrant pain EXAM: ULTRASOUND ABDOMEN LIMITED RIGHT UPPER QUADRANT COMPARISON:  08/27/2020 FINDINGS: Gallbladder: Surgically absent. Common bile duct: Diameter: 6 mm. Liver: No focal lesion identified. Mildly increased hepatic parenchymal echogenicity. Portal vein is patent on color Doppler imaging with normal direction of blood flow towards the liver. Other: None. IMPRESSION: 1. The echogenicity of the liver is mildly increased. This is a nonspecific finding but is most commonly seen with fatty infiltration of the liver. There are no obvious focal liver lesions. 2. Prior cholecystectomy. Electronically Signed   By: Davina Poke D.O.   On: 02/02/2021 15:57    Procedures Procedures   Medications Ordered in ED Medications  lactulose (CHRONULAC) 10 GM/15ML solution 30 g (has no administration in time range)    ED Course  I have reviewed the triage vital signs and the nursing notes.  Pertinent labs & imaging results that were available during my care of the patient were reviewed by me and considered in  my medical decision making (see chart for details).    MDM Rules/Calculators/A&P                            Patient presents to the emergency department for evaluation of right-sided pain.  Pain seems to be located under the right ribs but does go up into the right upper chest area.  Patient is concerned that it is her liver that is causing her pain because she was recently diagnosed with cirrhosis.  Right upper quadrant ultrasound does not show any acute abnormality.  Abdominal exam is benign, no tenderness on exam.  LFTs with only mild transaminase elevation, at baseline.  Pro time is not elevated.  Patient does have thrombocytopenia at baseline, and findings today are consistent with her baseline.  No evidence of bleeding.  Ammonia is somewhat elevated.  She will initiate lactulose treatment and follow-up with GI.  Patient has a normal D-dimer.  Cardiac evaluation is unremarkable.  She is not short of breath, no tachycardia, tachypnea or hypoxia.  With the normal D-dimer and lack of symptoms, doubt PE.  Final Clinical Impression(s) / ED Diagnoses Final diagnoses:  Hyperammonemia ([place])    Rx / DC Orders ED Discharge Orders          Ordered    lactulose (CHRONULAC) 10 GM/15ML solution  3 times daily,   Status:  Discontinued        02/03/21 0445    lactulose (CHRONULAC) 10 GM/15ML solution  3 times daily        02/03/21 0456             Orpah Greek, MD 02/03/21 [phone]

## 2021-02-26 ENCOUNTER — Inpatient Hospital Stay (HOSPITAL_COMMUNITY): Payer: PRIVATE HEALTH INSURANCE

## 2021-03-05 ENCOUNTER — Ambulatory Visit (HOSPITAL_COMMUNITY): Payer: PRIVATE HEALTH INSURANCE | Admitting: Physician Assistant

## 2021-04-14 ENCOUNTER — Ambulatory Visit: Payer: PRIVATE HEALTH INSURANCE | Admitting: Gastroenterology

## 2021-04-14 ENCOUNTER — Encounter: Payer: Self-pay | Admitting: Internal Medicine

## 2022-08-21 IMAGING — US US ABDOMEN COMPLETE
1 series · 14 of 25 positions shown · non-contrast
Comparison: None.

CLINICAL DATA: Elevated LFTs and abdominal pain in the right upper
quadrant. Prior cholecystectomy.

EXAM:
ABDOMEN ULTRASOUND COMPLETE

[Series 1: us abdomen complete · 14 of 94 slices shown]
[im 1/94]
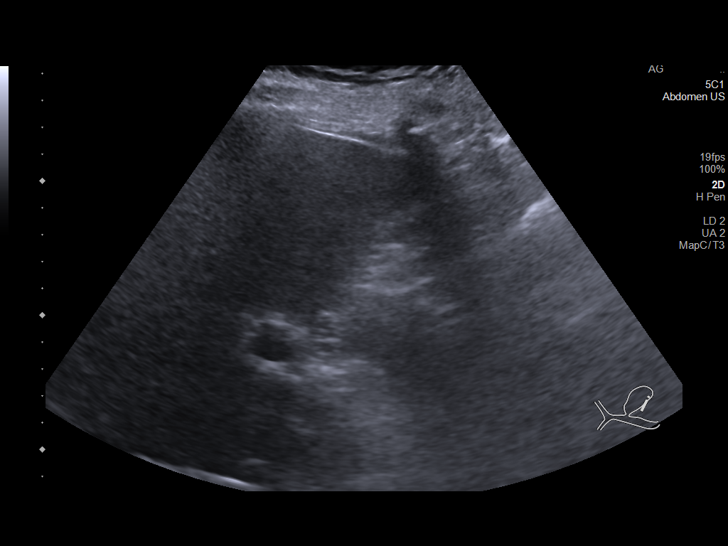
[im 8/94]
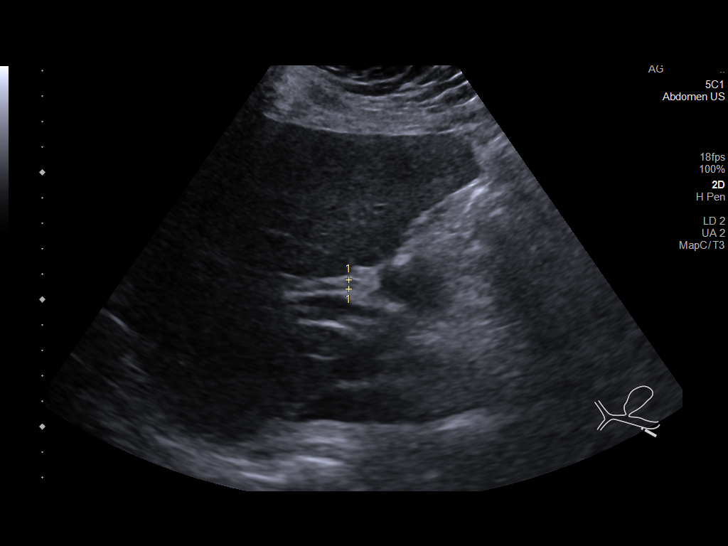
[im 16/94]
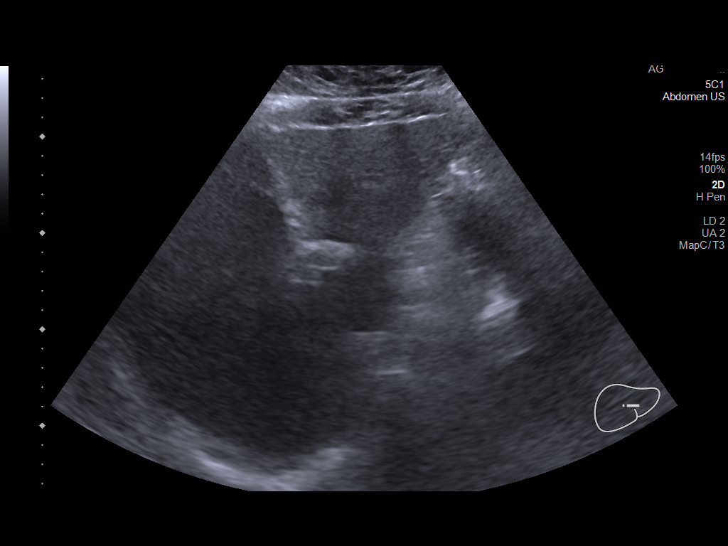
[im 24/94]
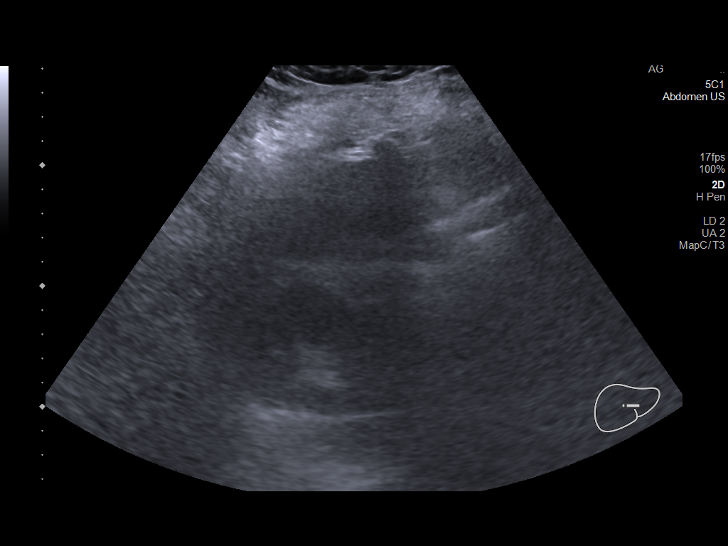
[im 32/94]
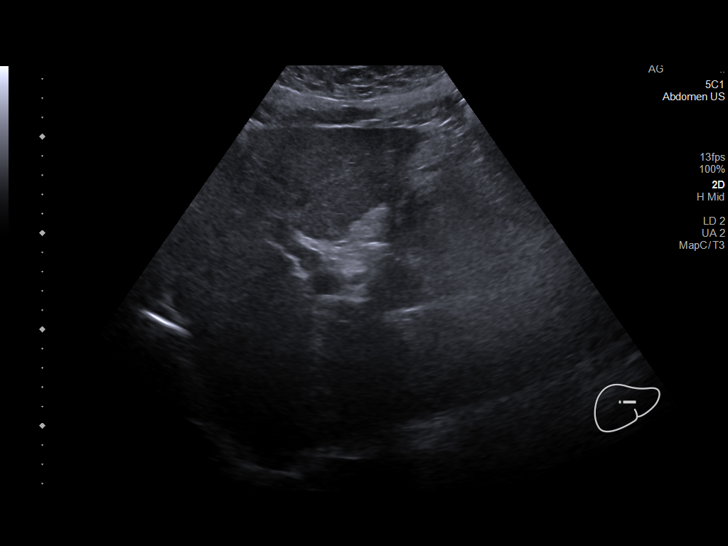
[im 35/94]
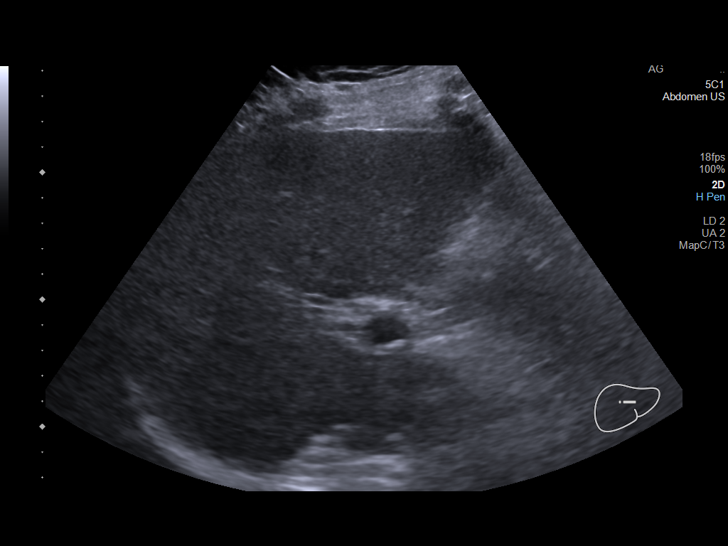
[im 43/94]
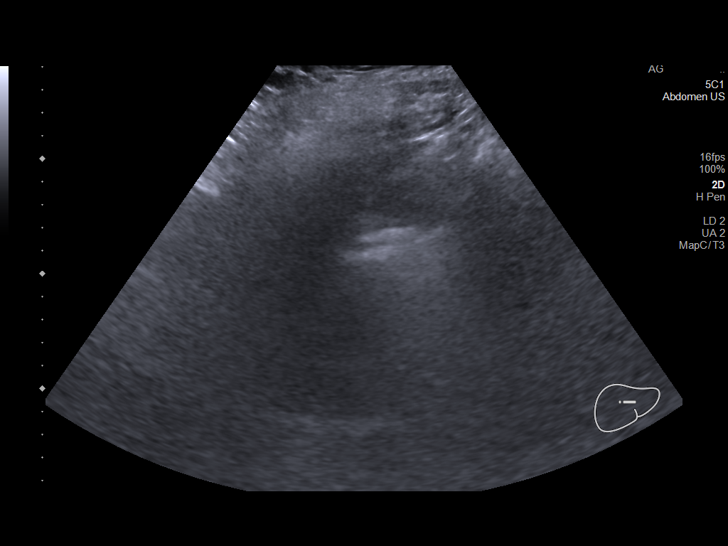
[im 51/94]
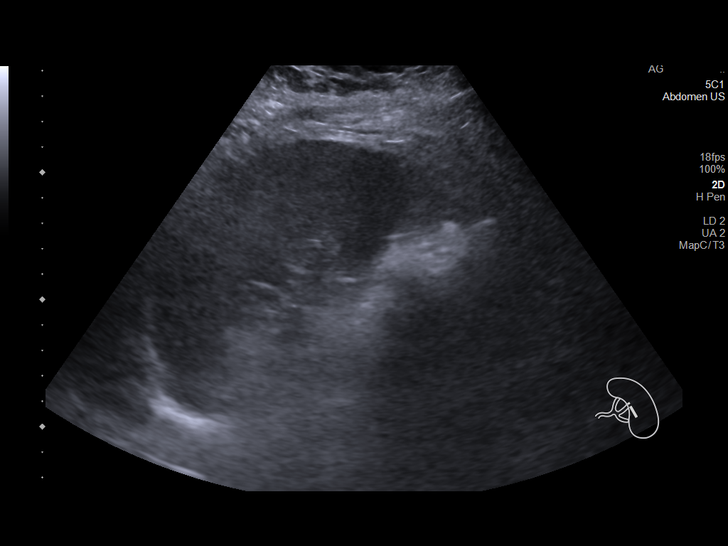
[im 59/94]
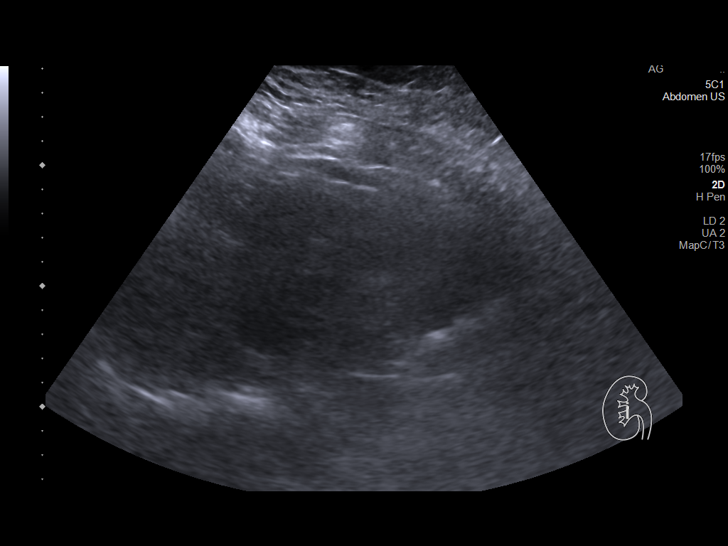
[im 63/94]
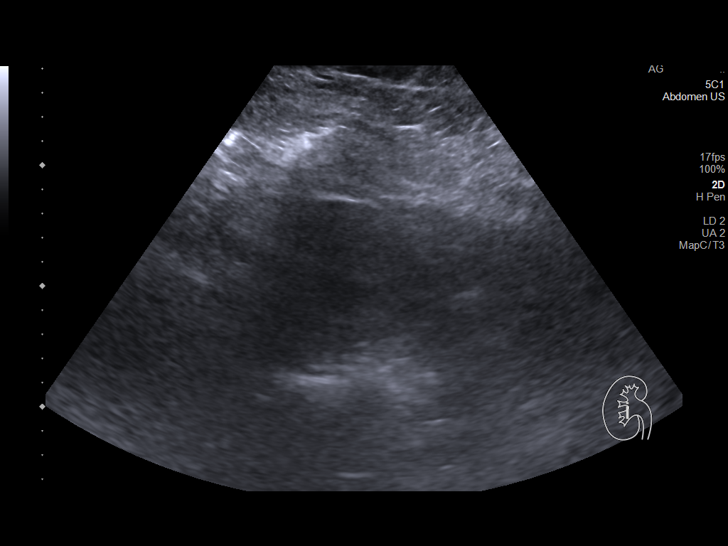
[im 70/94]
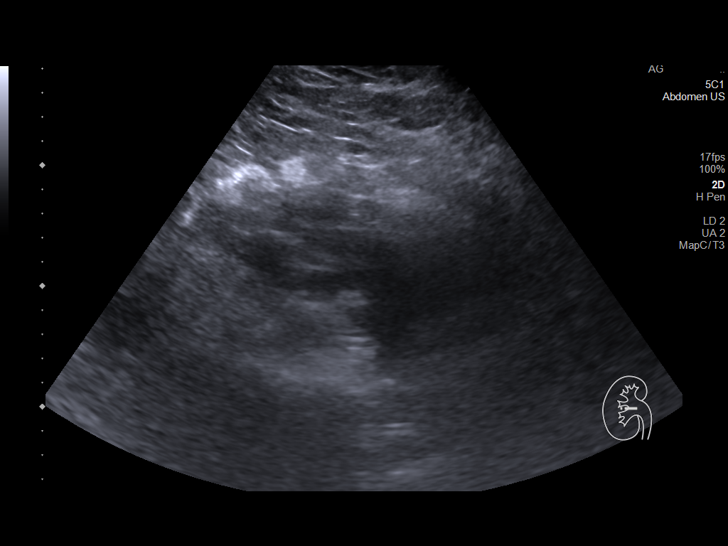
[im 78/94]
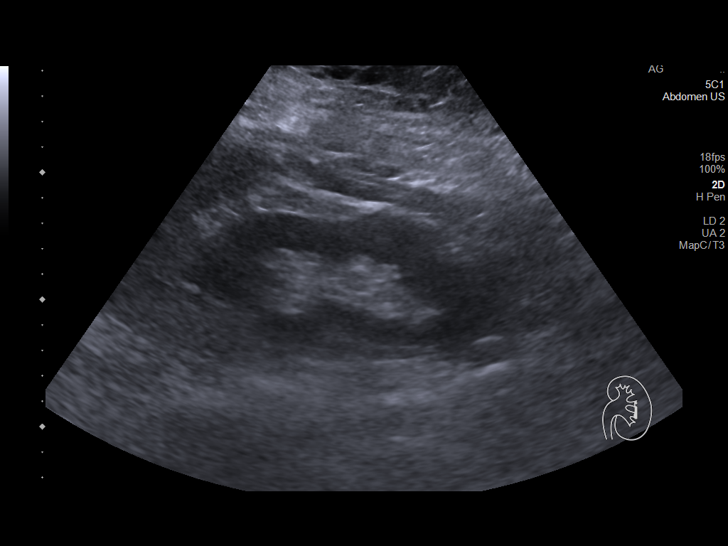
[im 86/94]
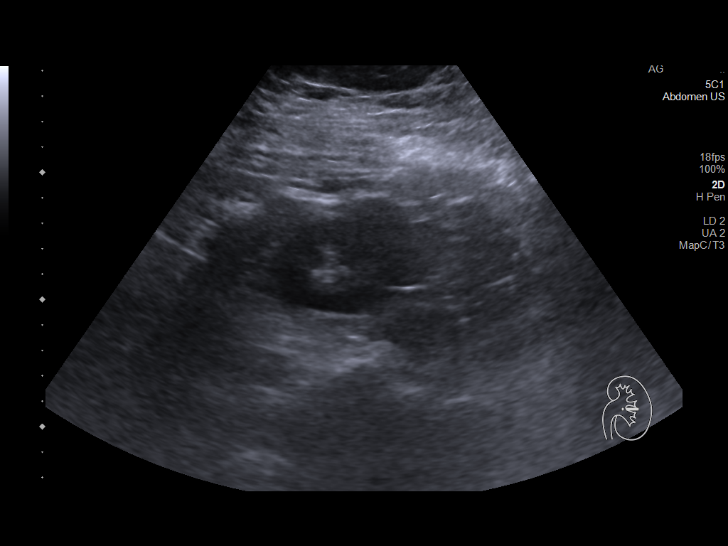
[im 94/94]
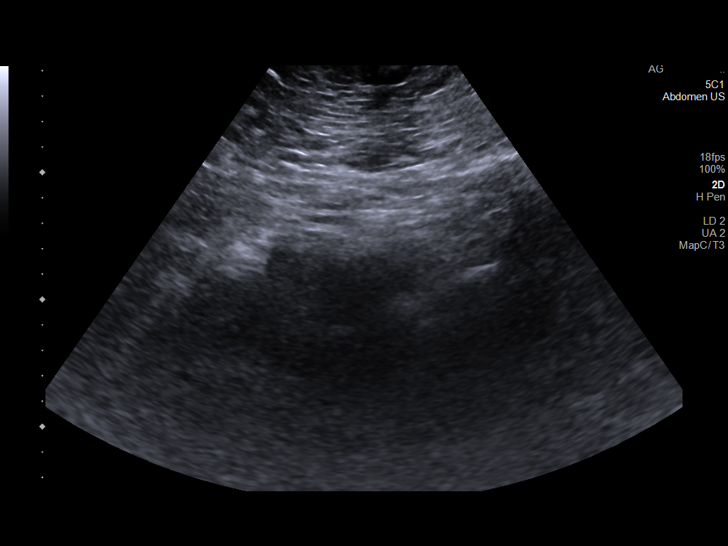

[14 of 25 positions shown; findings below may reference images not displayed]

FINDINGS: Gallbladder: Surgically absent

Common bile duct: Diameter: 4 mm

Liver: No focal lesion identified. Diffusely increased parenchymal
echogenicity. Portal vein is patent on color Doppler imaging with
normal direction of blood flow towards the liver.

IVC: No abnormality visualized.

Pancreas: Obscured by overlying bowel gas

Spleen: Size and appearance within normal limits.

Right Kidney: Length: 12.2 cm. Echogenicity within normal limits. No
mass or hydronephrosis visualized.

Left Kidney: Length: 12.1 cm. Echogenicity within normal limits. No
mass or hydronephrosis visualized.

Abdominal aorta: No aneurysm visualized.

Other findings: None.
IMPRESSION: 1. The echogenicity of the liver is increased. This is a nonspecific
finding but is most commonly seen with fatty infiltration of the
liver. There are no obvious focal liver lesions.
2. Status post cholecystectomy.

## 2022-09-23 IMAGING — MR MR ABDOMEN WO/W CM MRCP
19 of 20 series · 45 of 48 positions shown · IV contrast (gadavist)
Comparison: Ultrasound on 08/27/2020

CLINICAL DATA: Right upper quadrant pain. Elevated liver function
tests. Elevated ferritin. Prior cholecystectomy.

EXAM:
MRI ABDOMEN WITHOUT AND WITH CONTRAST (INCLUDING MRCP)
TECHNIQUE: Multiplanar multisequence MR imaging of the abdomen was performed
both before and after the administration of intravenous contrast.
Heavily T2-weighted images of the biliary and pancreatic ducts were
obtained, and three-dimensional MRCP images were rendered by post
processing.
CONTRAST:  10mL GADAVIST GADOBUTROL 1 MMOL/ML IV SOLN

[Series 4: ax haste · axial · 6.0mm · 1.19mm/px · z∈[-147,+98]mm · 2 of 35 slices shown]
[im 1/35]
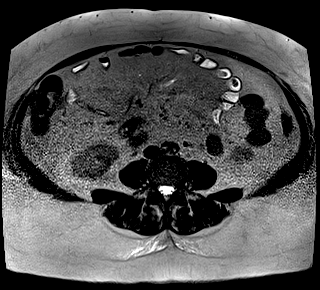
[im 35/35]
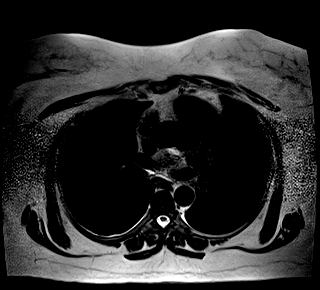

[Series 5: cor haste · coronal · 6.0mm · 1.25mm/px · 2 of 30 slices shown]
[im 1/30]
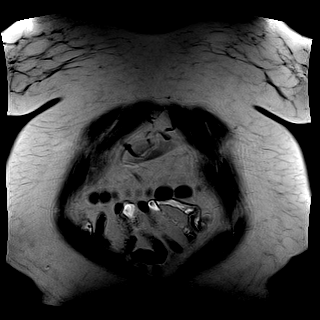
[im 30/30]
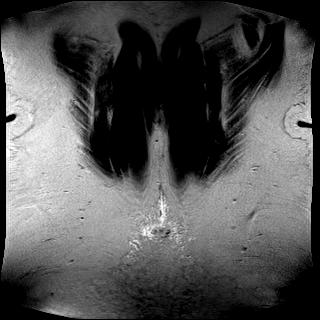

[Series 6: T2 fat-sat · axial · 6.0mm · 1.19mm/px · 1 of 35 slices shown]
[im 1/35]
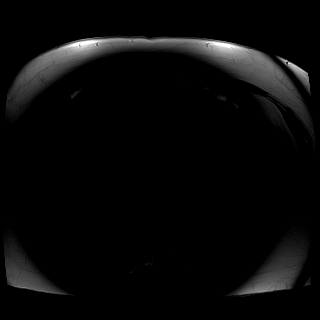

[Series 7: DWI · axial · 6.0mm · 1.64mm/px · 1 of 36 slices shown (1 of 4)]
[im 1/36]
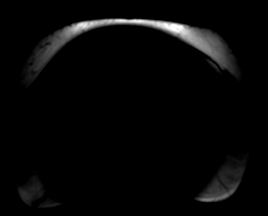

[Series 7: DWI · axial · 6.0mm · 1.64mm/px · 1 of 36 slices shown (2 of 4)]
[im 1/36]
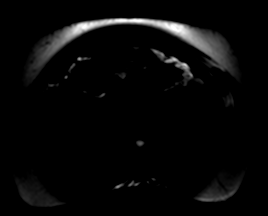

[Series 7: DWI · axial · 6.0mm · 1.64mm/px · 1 of 36 slices shown (3 of 4)]
[im 1/36]
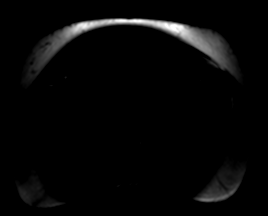

[Series 8: DWI · axial · 6.0mm · 1.64mm/px · 1 of 36 slices shown (4 of 4)]
[im 1/36]
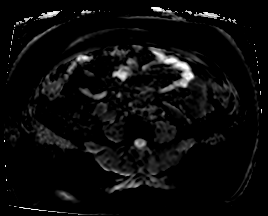

[Series 9: ax in and · axial · 3.0mm · 1.38mm/px · z∈[-131,+106]mm · 3 of 80 slices shown (1 of 2)]
[im 1/80]
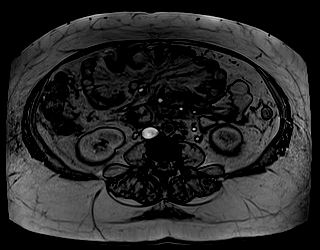
[im 40/80]
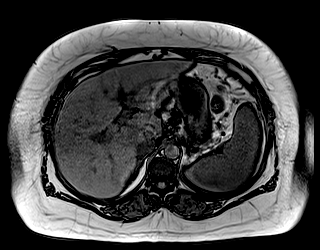
[im 80/80]
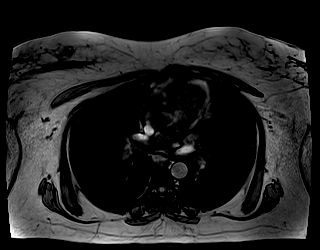

[Series 10: ax in and · axial · 3.0mm · 1.38mm/px · z∈[-131,+106]mm · 3 of 80 slices shown (2 of 2)]
[im 1/80]
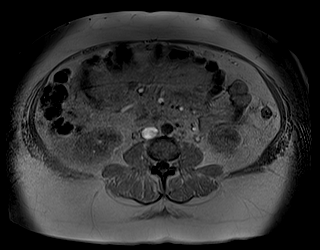
[im 40/80]
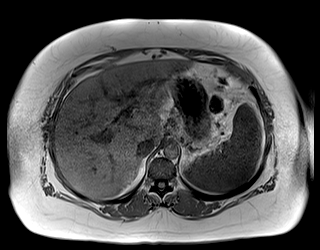
[im 80/80]
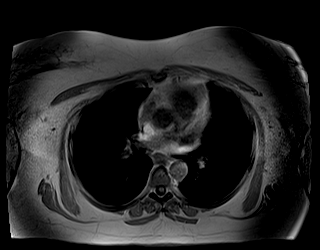

[Series 15: T1 dynamic · axial · non-contrast · 3.0mm · 1.19mm/px · z∈[-115,+98]mm · 3 of 72 slices shown (1 of 4)]
[im 1/72]
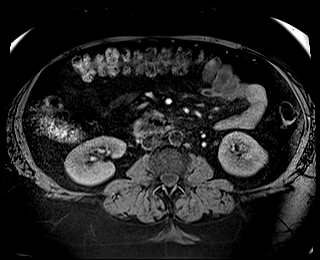
[im 36/72]
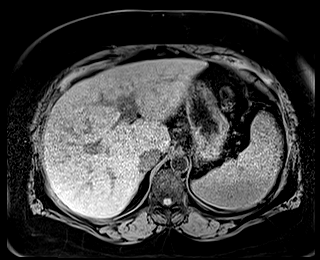
[im 72/72]
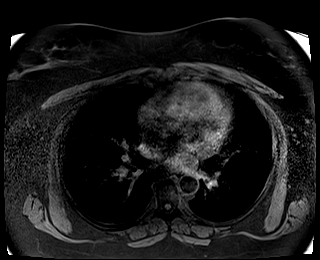

[Series 17: T1 dynamic post-contrast · axial · 3.0mm · 1.19mm/px · z∈[-115,+98]mm · 3 of 72 slices shown (1 of 6)]
[im 1/72]
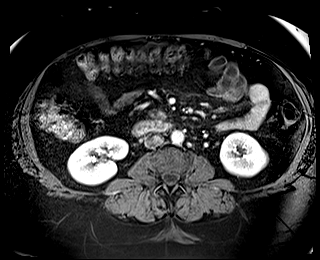
[im 36/72]
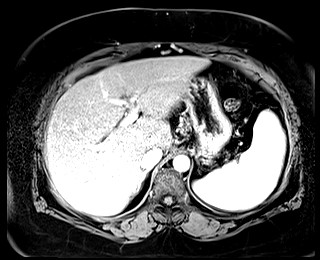
[im 72/72]
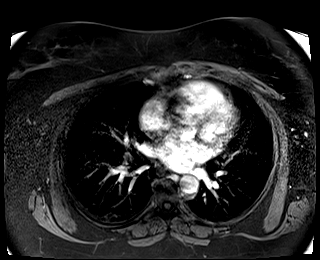

[Series 18: T1 dynamic · axial · 3.0mm · 1.19mm/px · z∈[-115,+98]mm · 3 of 72 slices shown (2 of 4)]
[im 1/72]
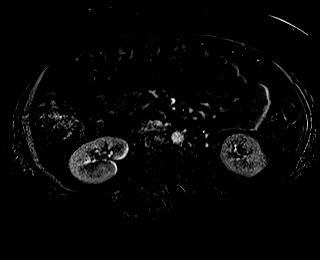
[im 36/72]
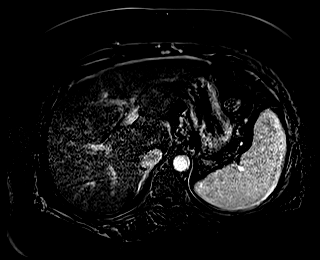
[im 72/72]
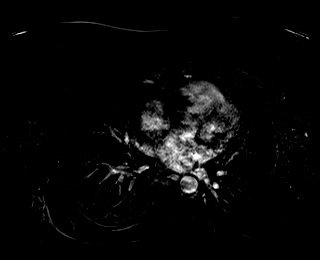

[Series 19: T1 dynamic post-contrast · axial · 3.0mm · 1.19mm/px · z∈[-115,+98]mm · 3 of 72 slices shown (2 of 6)]
[im 1/72]
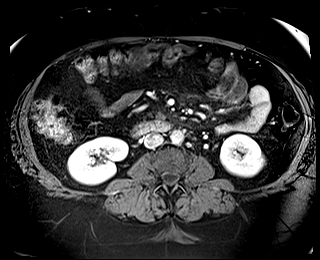
[im 36/72]
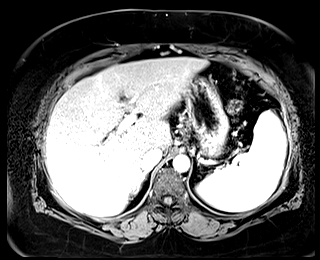
[im 72/72]
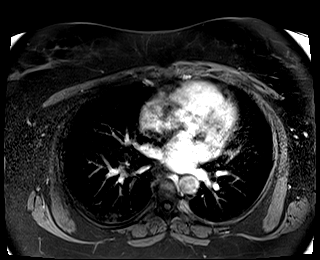

[Series 20: T1 dynamic · axial · 3.0mm · 1.19mm/px · z∈[-115,+98]mm · 3 of 72 slices shown (3 of 4)]
[im 1/72]
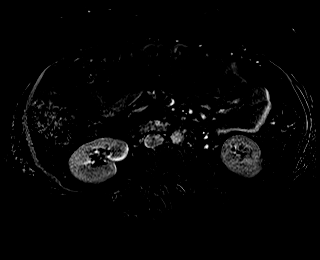
[im 36/72]
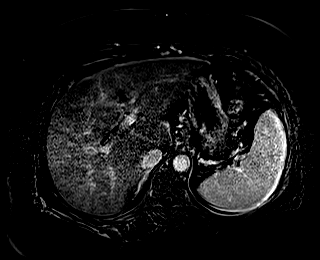
[im 72/72]
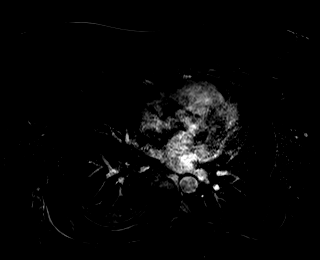

[Series 21: T1 dynamic post-contrast · axial · 3.0mm · 1.19mm/px · z∈[-115,+98]mm · 3 of 72 slices shown (3 of 6)]
[im 1/72]
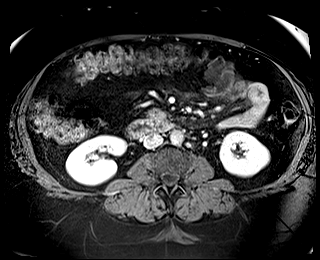
[im 36/72]
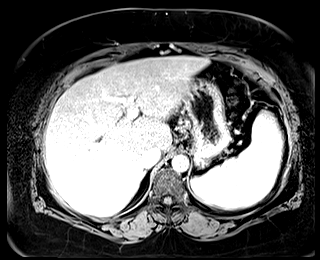
[im 72/72]
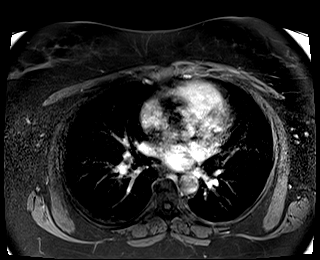

[Series 22: T1 dynamic · axial · 3.0mm · 1.19mm/px · z∈[-115,+98]mm · 3 of 72 slices shown (4 of 4)]
[im 1/72]
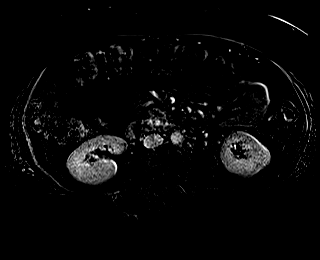
[im 36/72]
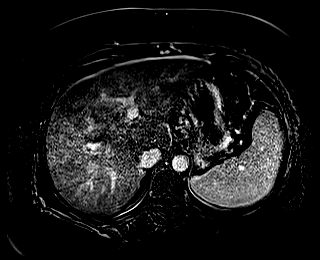
[im 72/72]
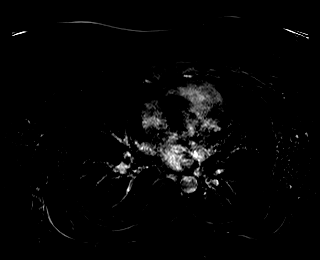

[Series 23: T1 dynamic post-contrast · coronal · 3.0mm · 1.31mm/px · 3 of 80 slices shown (4 of 6)]
[im 1/80]
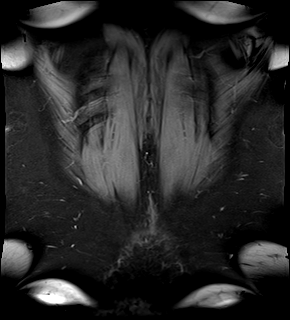
[im 40/80]
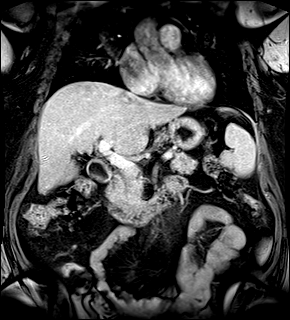
[im 80/80]
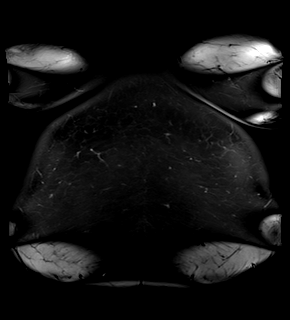

[Series 24: T1 dynamic post-contrast · axial · 3.0mm · 1.19mm/px · z∈[-115,+98]mm · 3 of 72 slices shown (5 of 6)]
[im 1/72]
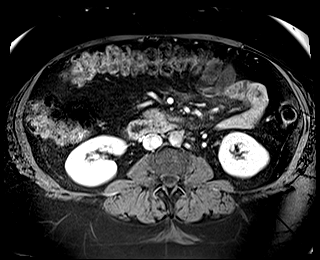
[im 36/72]
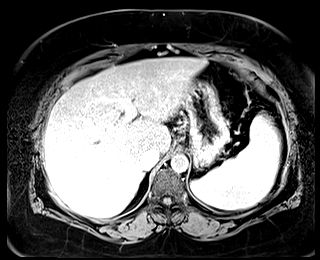
[im 72/72]
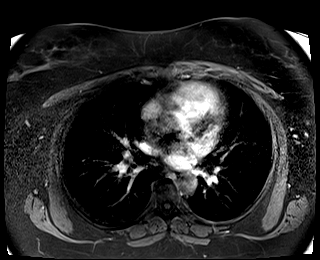

[Series 25: T1 dynamic post-contrast · axial · 3.0mm · 1.19mm/px · z∈[-115,+98]mm · 3 of 72 slices shown (6 of 6)]
[im 1/72]
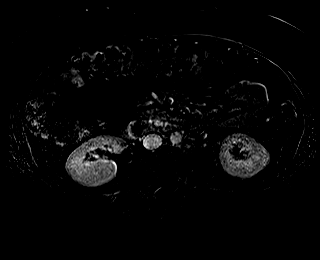
[im 36/72]
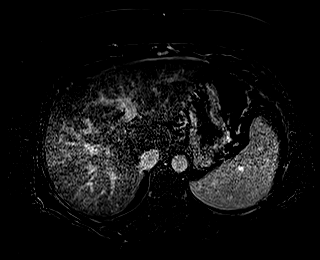
[im 72/72]
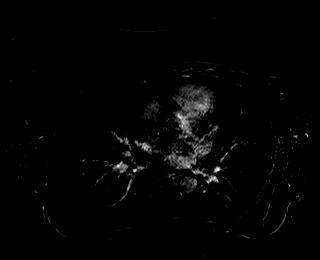

[45 of 48 positions shown; findings below may reference images not displayed]

FINDINGS: Lower chest: No acute findings.

Hepatobiliary: Tiny sub-cm cyst seen in the anterior left lobe. No
hepatic masses identified. No radiographic signs of steatosis or
iron overload. Prior cholecystectomy. No evidence of biliary
obstruction.

Pancreas:  No mass or inflammatory changes.

Spleen: Within normal limits in size and appearance. No evidence of
iron overload.

Adrenals/Urinary Tract: No masses identified. No evidence of
hydronephrosis.

Stomach/Bowel: Visualized portion unremarkable.

Vascular/Lymphatic: No pathologically enlarged lymph nodes
identified. No abdominal aortic aneurysm.

Other:  None.

Musculoskeletal:  No suspicious bone lesions identified.
IMPRESSION: No acute findings or other significant abnormality.

Prior cholecystectomy. No evidence of biliary obstruction. No
hepatic abnormality identified.

## 2023-01-27 IMAGING — US US ABDOMEN LIMITED
1 series · 14 of 24 positions shown · non-contrast
Comparison: 08/27/2020

CLINICAL DATA: Right upper quadrant pain

EXAM:
ULTRASOUND ABDOMEN LIMITED RIGHT UPPER QUADRANT

[Series 1: us abdomen limited · 14 of 24 slices shown]
[im 1/24]
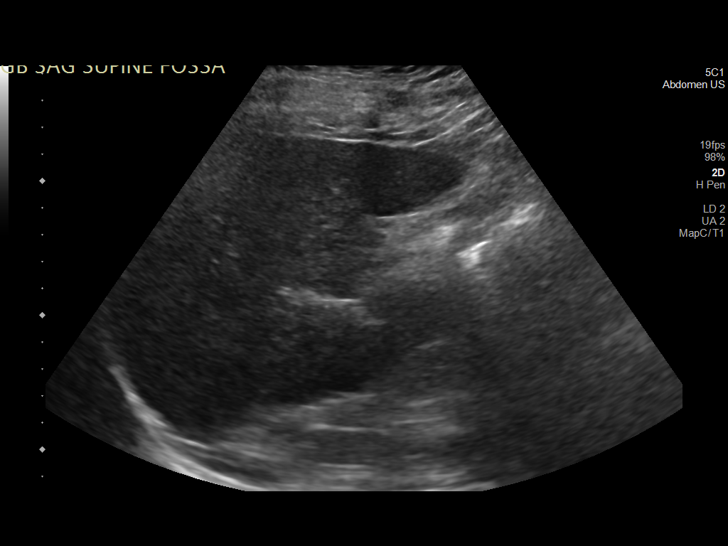
[im 3/24]
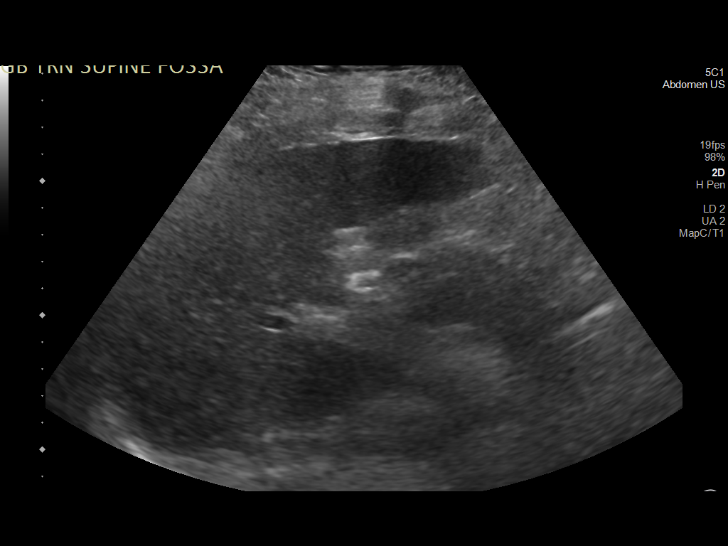
[im 5/24]
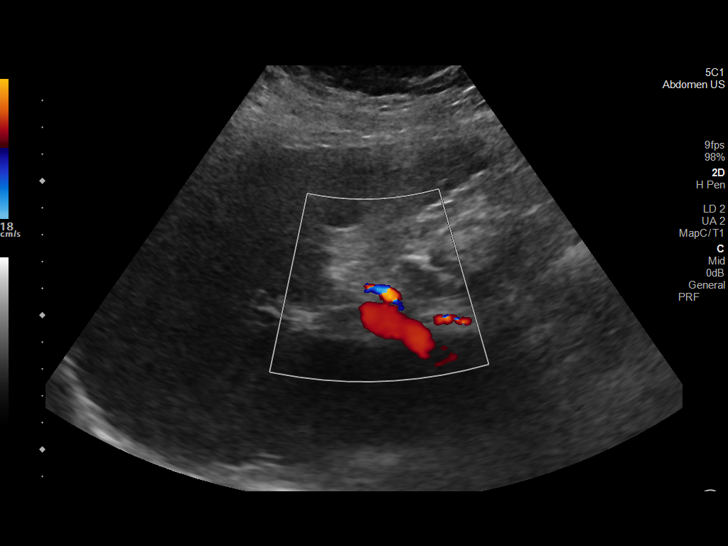
[im 7/24]
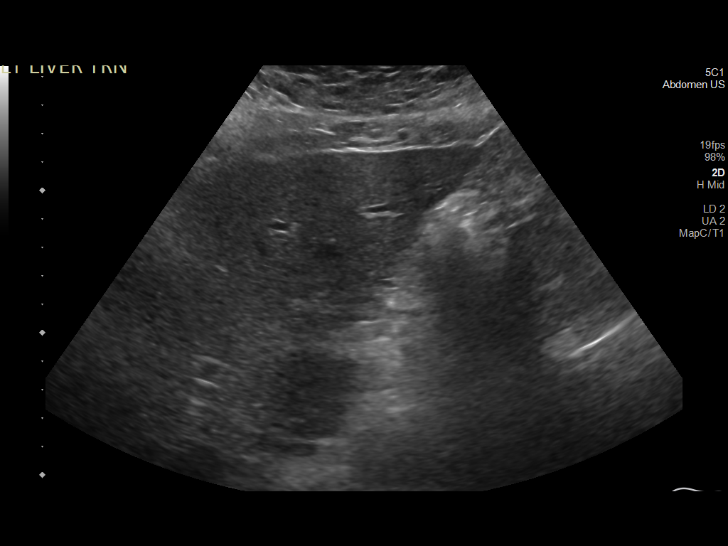
[im 8/24]
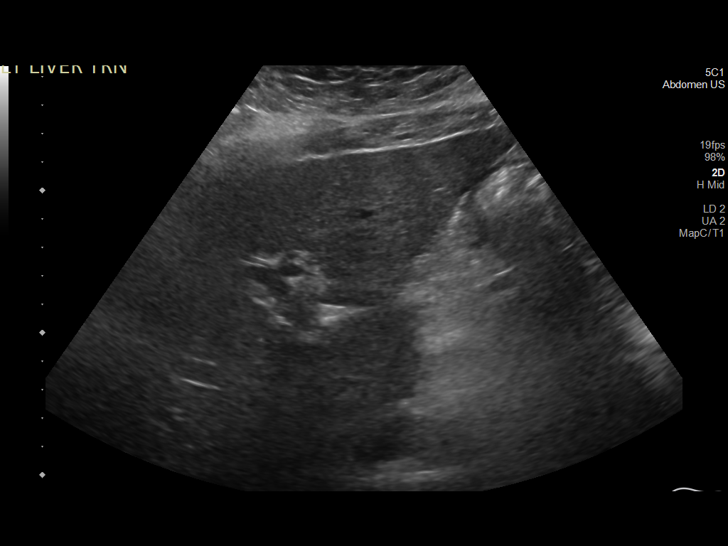
[im 10/24]
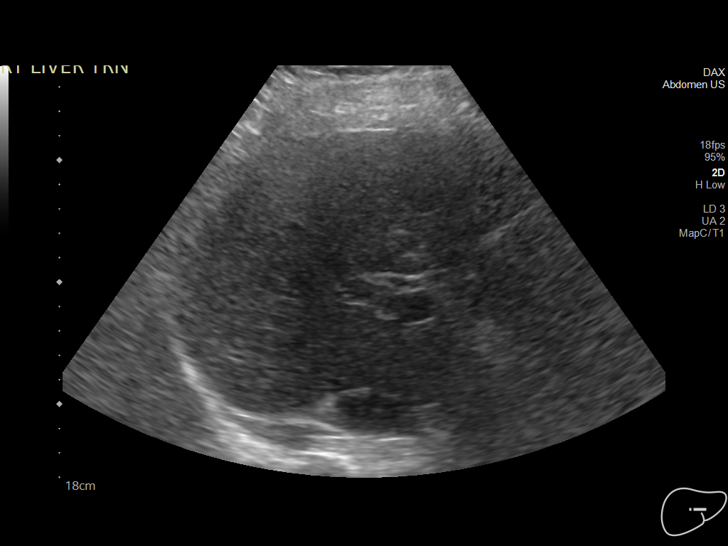
[im 12/24]
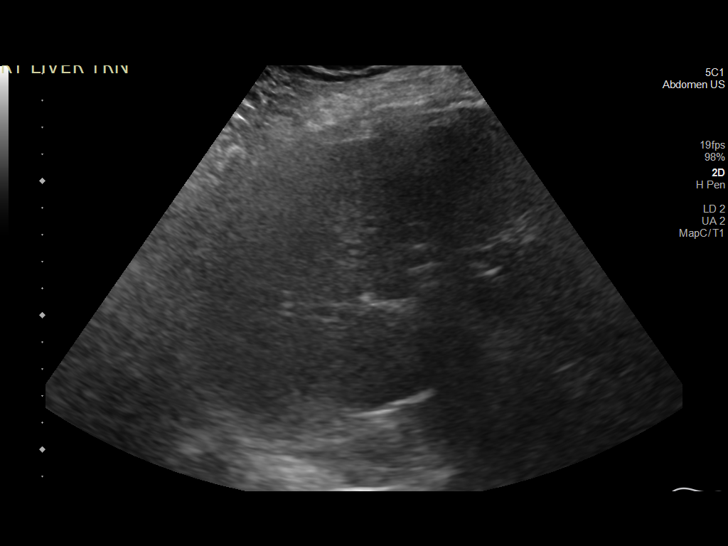
[im 13/24]
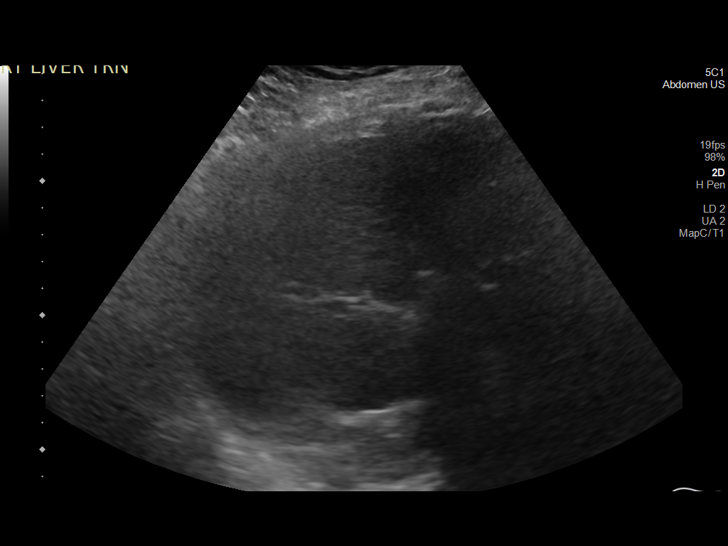
[im 15/24]
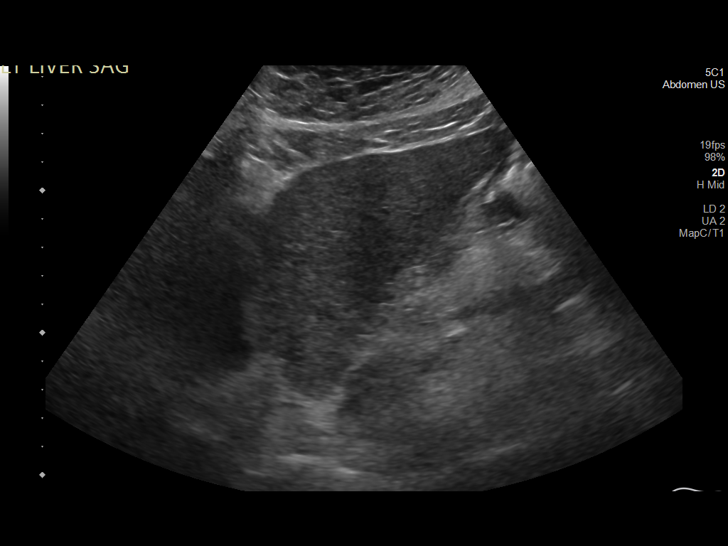
[im 17/24]
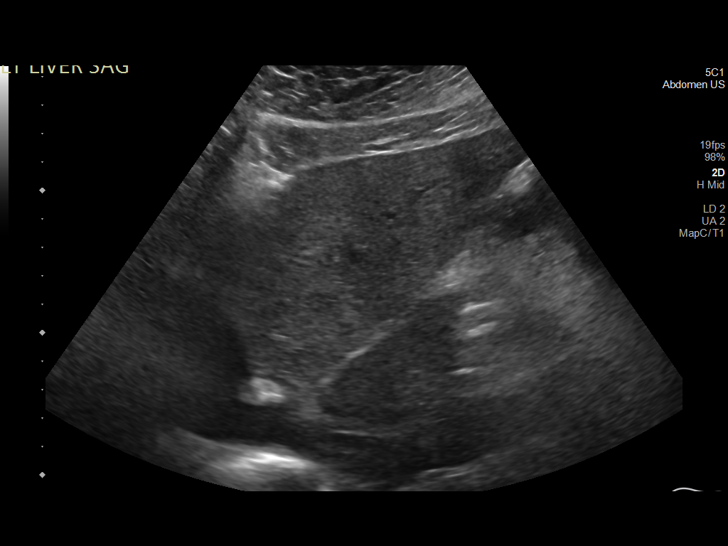
[im 19/24]
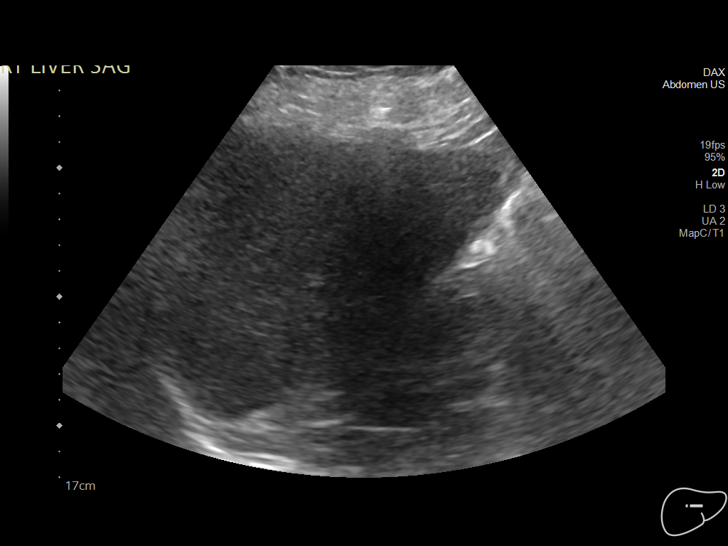
[im 20/24]
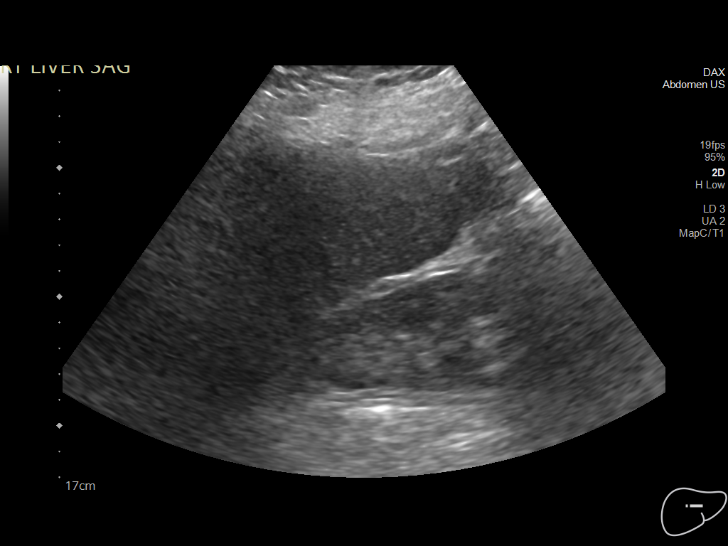
[im 22/24]
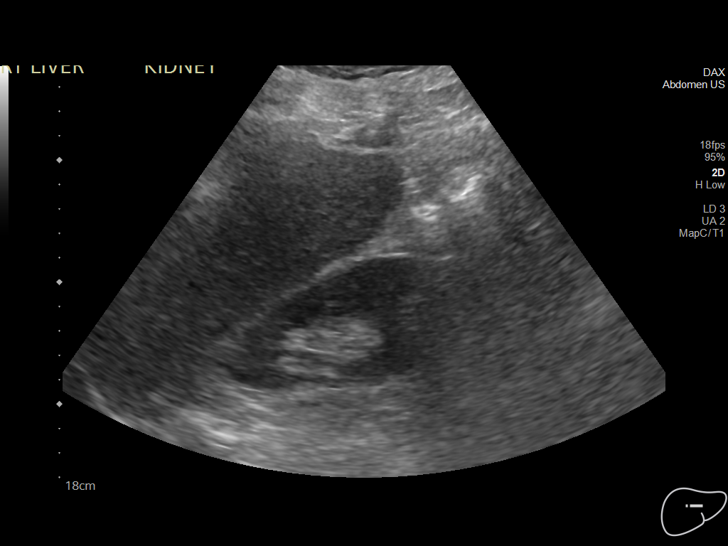
[im 24/24]
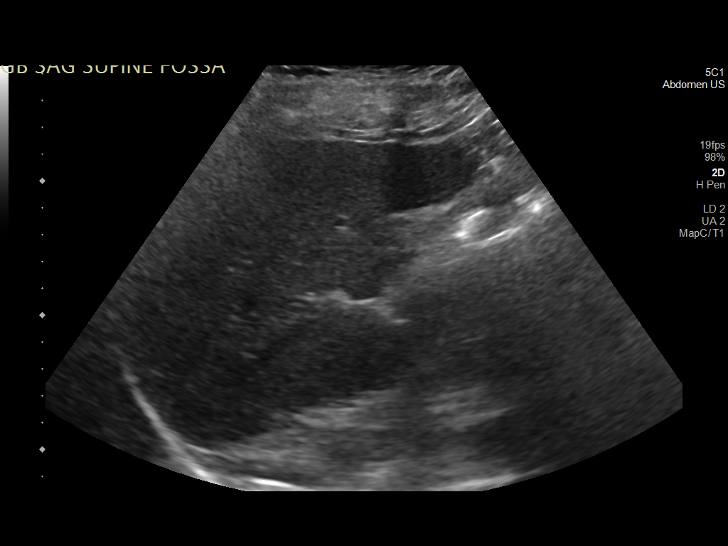

[14 of 24 positions shown; findings below may reference images not displayed]

FINDINGS: Gallbladder:

Surgically absent.

Common bile duct:

Diameter: 6 mm.

Liver:

No focal lesion identified. Mildly increased hepatic parenchymal
echogenicity. Portal vein is patent on color Doppler imaging with
normal direction of blood flow towards the liver.

Other: None.
IMPRESSION: 1. The echogenicity of the liver is mildly increased. This is a
nonspecific finding but is most commonly seen with fatty
infiltration of the liver. There are no obvious focal liver lesions.
2. Prior cholecystectomy.

## 2023-01-28 IMAGING — DX DG CHEST 2V
2 series · 2 of 2 positions shown · non-contrast
Comparison: Radiograph 01/20/2020, CT 08/13/2018

CLINICAL DATA: Right chest pain

EXAM:
CHEST - 2 VIEW

[chest pa]
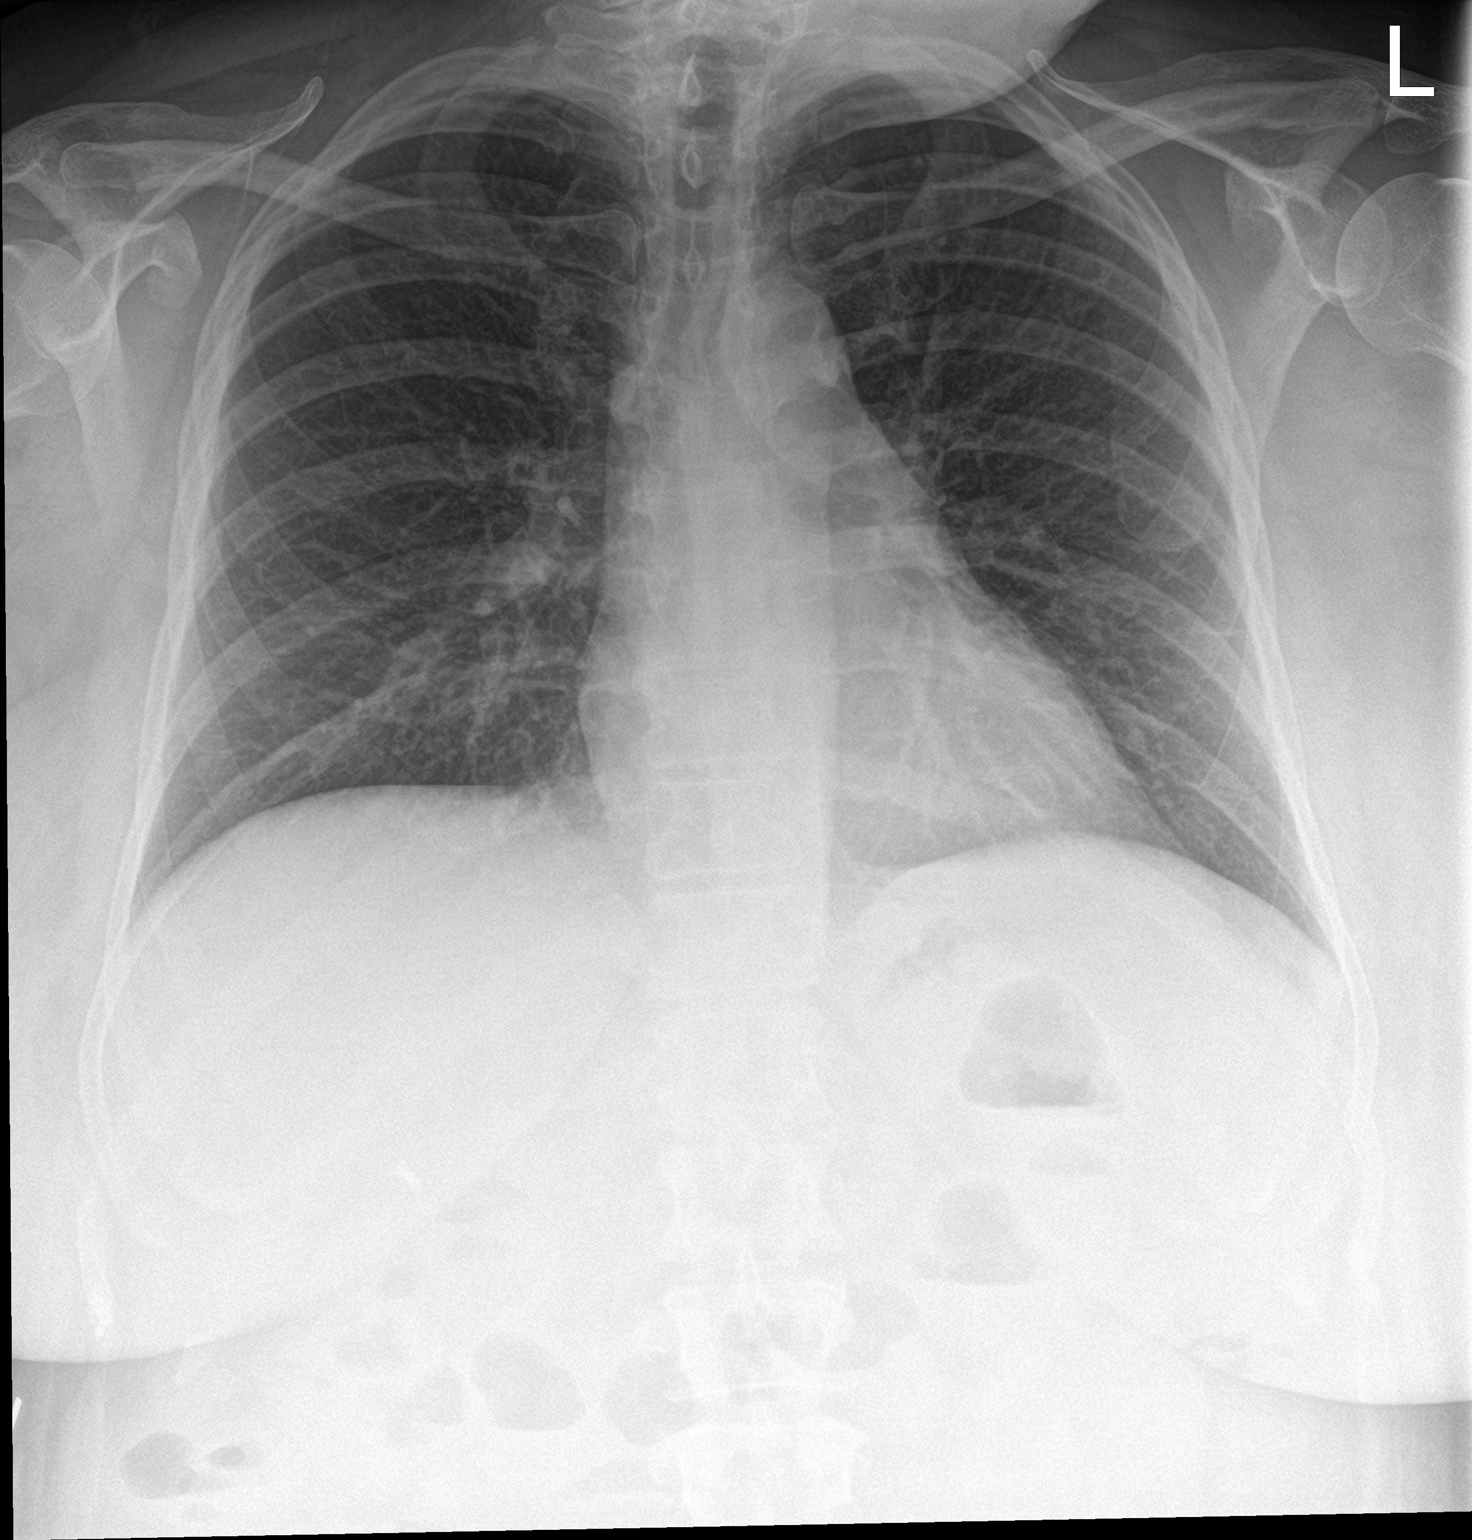

[chest lat]
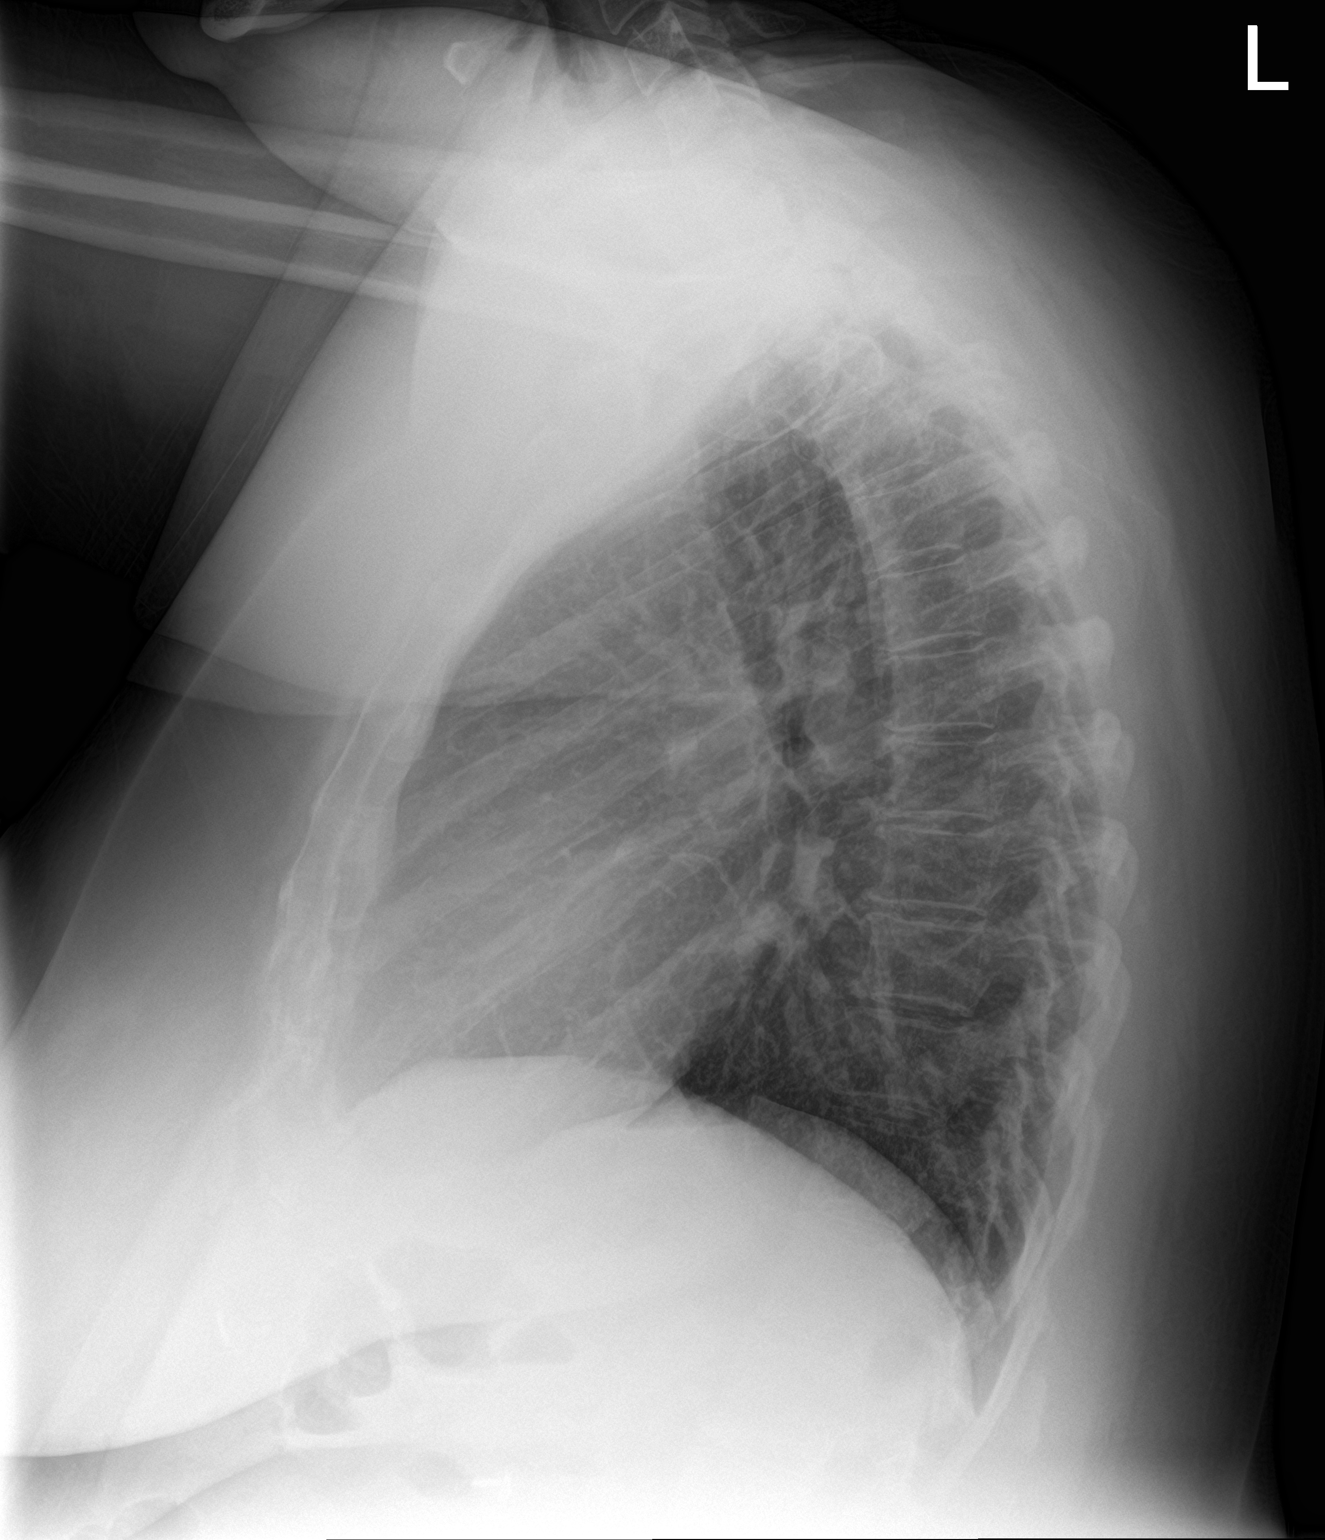

[2 of 2 positions shown; findings below may reference images not displayed]

FINDINGS: No consolidation, features of edema, pneumothorax, or effusion.
Pulmonary vascularity is normally distributed. The cardiomediastinal
contours are unremarkable. No acute osseous or soft tissue
abnormality. Cholecystectomy clips in the right upper quadrant.
IMPRESSION: No acute cardiopulmonary or visible chest wall abnormality.
